# Patient Record
Sex: Female | Born: 1970 | Race: Black or African American | Hispanic: No | Marital: Single | Smoking: Current some day smoker
Health system: Southern US, Community
[De-identification: ages and names within clinical notes are randomized; demographics above are authoritative.]

## PROBLEM LIST (undated history)

## (undated) DIAGNOSIS — E669 Obesity, unspecified: Secondary | ICD-10-CM

---

## 2008-05-27 HISTORY — PX: HERNIA REPAIR: SHX51

## 2014-07-05 ENCOUNTER — Encounter (HOSPITAL_COMMUNITY): Payer: Self-pay | Admitting: Emergency Medicine

## 2014-07-05 ENCOUNTER — Emergency Department (HOSPITAL_COMMUNITY)
Admission: EM | Admit: 2014-07-05 | Discharge: 2014-07-05 | Disposition: A | Discharge status: Home / Self Care | Payer: Medicaid Other | Attending: Emergency Medicine | Admitting: Emergency Medicine

## 2014-07-05 ENCOUNTER — Emergency Department (HOSPITAL_COMMUNITY): Payer: Medicaid Other

## 2014-07-05 DIAGNOSIS — N939 Abnormal uterine and vaginal bleeding, unspecified: Secondary | ICD-10-CM

## 2014-07-05 DIAGNOSIS — Z3490 Encounter for supervision of normal pregnancy, unspecified, unspecified trimester: Secondary | ICD-10-CM

## 2014-07-05 DIAGNOSIS — O2342 Unspecified infection of urinary tract in pregnancy, second trimester: Secondary | ICD-10-CM | POA: Diagnosis not present

## 2014-07-05 DIAGNOSIS — O23592 Infection of other part of genital tract in pregnancy, second trimester: Secondary | ICD-10-CM | POA: Diagnosis not present

## 2014-07-05 DIAGNOSIS — Z3A15 15 weeks gestation of pregnancy: Secondary | ICD-10-CM | POA: Insufficient documentation

## 2014-07-05 DIAGNOSIS — O99332 Smoking (tobacco) complicating pregnancy, second trimester: Secondary | ICD-10-CM | POA: Diagnosis not present

## 2014-07-05 DIAGNOSIS — O2340 Unspecified infection of urinary tract in pregnancy, unspecified trimester: Secondary | ICD-10-CM

## 2014-07-05 DIAGNOSIS — B9689 Other specified bacterial agents as the cause of diseases classified elsewhere: Secondary | ICD-10-CM

## 2014-07-05 DIAGNOSIS — N760 Acute vaginitis: Secondary | ICD-10-CM

## 2014-07-05 DIAGNOSIS — F17210 Nicotine dependence, cigarettes, uncomplicated: Secondary | ICD-10-CM | POA: Diagnosis not present

## 2014-07-05 DIAGNOSIS — O209 Hemorrhage in early pregnancy, unspecified: Secondary | ICD-10-CM | POA: Diagnosis present

## 2014-07-05 LAB — CBC WITH DIFFERENTIAL/PLATELET
BASOS PCT: 0 % (ref 0–1)
Basophils Absolute: 0 10*3/uL (ref 0.0–0.1)
Eosinophils Absolute: 0.6 10*3/uL (ref 0.0–0.7)
Eosinophils Relative: 6 % — ABNORMAL HIGH (ref 0–5)
HCT: 38.6 % (ref 36.0–46.0)
HEMOGLOBIN: 12.9 g/dL (ref 12.0–15.0)
LYMPHS ABS: 3.9 10*3/uL (ref 0.7–4.0)
LYMPHS PCT: 39 % (ref 12–46)
MCH: 29.3 pg (ref 26.0–34.0)
MCHC: 33.4 g/dL (ref 30.0–36.0)
MCV: 87.7 fL (ref 78.0–100.0)
MONOS PCT: 5 % (ref 3–12)
Monocytes Absolute: 0.5 10*3/uL (ref 0.1–1.0)
NEUTROS PCT: 50 % (ref 43–77)
Neutro Abs: 5.1 10*3/uL (ref 1.7–7.7)
Platelets: 249 10*3/uL (ref 150–400)
RBC: 4.4 MIL/uL (ref 3.87–5.11)
RDW: 16.6 % — ABNORMAL HIGH (ref 11.5–15.5)
WBC: 10 10*3/uL (ref 4.0–10.5)

## 2014-07-05 LAB — COMPREHENSIVE METABOLIC PANEL
ALBUMIN: 4.1 g/dL (ref 3.5–5.2)
ALK PHOS: 82 U/L (ref 39–117)
ALT: 17 U/L (ref 0–35)
AST: 19 U/L (ref 0–37)
Anion gap: 10 (ref 5–15)
BILIRUBIN TOTAL: 0.5 mg/dL (ref 0.3–1.2)
BUN: 8 mg/dL (ref 6–23)
CHLORIDE: 106 mmol/L (ref 96–112)
CO2: 20 mmol/L (ref 19–32)
Calcium: 9.4 mg/dL (ref 8.4–10.5)
Creatinine, Ser: 0.65 mg/dL (ref 0.50–1.10)
GFR calc Af Amer: >90 mL/min (ref >90)
GFR calc non Af Amer: >90 mL/min (ref >90)
Glucose, Bld: 92 mg/dL (ref 70–99)
Potassium: 3.8 mmol/L (ref 3.5–5.1)
Sodium: 136 mmol/L (ref 135–145)
Total Protein: 7.9 g/dL (ref 6.0–8.3)

## 2014-07-05 LAB — POC URINE PREG, ED: PREG TEST UR: POSITIVE — AB

## 2014-07-05 LAB — URINALYSIS, ROUTINE W REFLEX MICROSCOPIC
Bilirubin Urine: NEGATIVE
GLUCOSE, UA: NEGATIVE mg/dL
KETONES UR: NEGATIVE mg/dL
Nitrite: NEGATIVE
PROTEIN: NEGATIVE mg/dL
Specific Gravity, Urine: 1.011 (ref 1.005–1.030)
UROBILINOGEN UA: 0.2 mg/dL (ref 0.0–1.0)
pH: 6 (ref 5.0–8.0)

## 2014-07-05 LAB — WET PREP, GENITAL
Trich, Wet Prep: NONE SEEN
Yeast Wet Prep HPF POC: NONE SEEN

## 2014-07-05 LAB — HCG, QUANTITATIVE, PREGNANCY: HCG, BETA CHAIN, QUANT, S: 11247 m[IU]/mL — AB (ref <5)

## 2014-07-05 LAB — ABO/RH: ABO/RH(D): A POS

## 2014-07-05 LAB — URINE MICROSCOPIC-ADD ON

## 2014-07-05 MED ORDER — NITROFURANTOIN MONOHYD MACRO 100 MG PO CAPS: 100.0000 mg | ORAL_CAPSULE | Freq: Two times a day (BID) | ORAL | Status: DC

## 2014-07-05 MED ORDER — METRONIDAZOLE 500 MG PO TABS: 500.0000 mg | ORAL_TABLET | Freq: Two times a day (BID) | ORAL | Status: DC

## 2014-07-05 NOTE — ED Provider Notes (Signed)
CSN: [id number]     Arrival date & time 07/05/14  1209 History   None    Chief Complaint  Patient presents with  . Vaginal Bleeding     (Consider location/radiation/quality/duration/timing/severity/associated sxs/prior Treatment) HPI  44 year old female who is [id number] who presents to ED with painless vaginal bleeding that began today. Patient states she is currently pregnant and believes she is around [redacted] weeks pregnant. She has not had any prenatal care at this time. She is also not had a ultrasound as of yet. She denies abdominal pain, nausea, vomiting. She does note having a small amount of vaginal bleeding and vaginal discharge. She denies any new sexual partners. Also denies fevers, dysuria. Patient states she has never had bleeding with any of her other pregnancies. No other complaints at this time.   History reviewed. No pertinent past medical history. History reviewed. No pertinent past surgical history. History reviewed. No pertinent family history. History  Substance Use Topics  . Smoking status: Current Every Day Smoker  . Smokeless tobacco: Not on file  . Alcohol Use: No   OB History    Gravida Para Term Preterm AB TAB SAB Ectopic Multiple Living   1              Review of Systems  Constitutional: Negative for fever and chills.  HENT: Negative for congestion, rhinorrhea and sore throat.   Eyes: Negative for visual disturbance.  Respiratory: Negative for cough and shortness of breath.   Cardiovascular: Negative for chest pain, palpitations and leg swelling.  Gastrointestinal: Negative for nausea, vomiting, abdominal pain, diarrhea and constipation.  Genitourinary: Positive for vaginal bleeding and vaginal discharge. Negative for dysuria and hematuria.  Musculoskeletal: Negative for back pain and neck pain.  Skin: Negative for rash.  Neurological: Negative for weakness and headaches.  All other systems reviewed and are negative.     Allergies  Review of patient's  allergies indicates no known allergies.  Home Medications   Prior to Admission medications   Not on File   BP 124/73 mmHg  Pulse 97  Temp(Src) 98.7 F (37.1 C) (Oral)  Resp 18  SpO2 100%  LMP 03/20/2014 Physical Exam  Constitutional: She is oriented to person, place, and time. She appears well-developed and well-nourished. No distress.  HENT:  Head: Normocephalic and atraumatic.  Eyes: Conjunctivae are normal.  Neck: Normal range of motion.  Cardiovascular: Normal rate, regular rhythm, normal heart sounds and intact distal pulses.   No murmur heard. Pulmonary/Chest: Effort normal and breath sounds normal. No respiratory distress. She has no wheezes. She has no rales. She exhibits no tenderness.  Abdominal: Soft. Bowel sounds are normal. She exhibits no distension and no mass. There is no tenderness. There is no rebound and no guarding.  Genitourinary: Uterus is not enlarged and not tender. Cervix exhibits discharge (thick yellow / brown as well as small amount of blood). Cervix exhibits no motion tenderness and no friability. Right adnexum displays no mass, no tenderness and no fullness. Left adnexum displays no mass, no tenderness and no fullness. There is bleeding in the vagina. No erythema or tenderness in the vagina. Vaginal discharge found.  Musculoskeletal: Normal range of motion.  Neurological: She is alert and oriented to person, place, and time. No cranial nerve deficit.  Skin: Skin is warm and dry.  Psychiatric: She has a normal mood and affect.  Nursing note and vitals reviewed.   ED Course  Procedures (including critical care time) Labs Review Labs Reviewed  URINALYSIS,  ROUTINE W REFLEX MICROSCOPIC - Abnormal; Notable for the following:    APPearance CLOUDY (*)    Hgb urine dipstick MODERATE (*)    Leukocytes, UA MODERATE (*)    All other components within normal limits  CBC WITH DIFFERENTIAL/PLATELET - Abnormal; Notable for the following:    RDW 16.6 (*)     Eosinophils Relative 6 (*)    All other components within normal limits  HCG, QUANTITATIVE, PREGNANCY - Abnormal; Notable for the following:    hCG, Beta Chain, Quant, S 11247 (*)    All other components within normal limits  URINE MICROSCOPIC-ADD ON - Abnormal; Notable for the following:    Squamous Epithelial / LPF FEW (*)    Bacteria, UA FEW (*)    All other components within normal limits  POC URINE PREG, ED - Abnormal; Notable for the following:    Preg Test, Ur POSITIVE (*)    All other components within normal limits  WET PREP, GENITAL  COMPREHENSIVE METABOLIC PANEL  ABO/RH  GC/CHLAMYDIA PROBE AMP ([place])    Imaging Review US Ob Comp Less 14 Wks  07/05/2014   CLINICAL DATA:  Vaginal bleeding, spotting.  EXAM: OBSTETRIC <14 WK [place] AND TRANSVAGINAL OB US  TECHNIQUE: Both transabdominal and transvaginal ultrasound examinations were performed for complete evaluation of the gestation as well as the maternal uterus, adnexal regions, and pelvic cul-de-sac. Transvaginal technique was performed to assess early pregnancy.  COMPARISON:  None.  FINDINGS: Intrauterine gestational sac: Visualized/ somewhat elongated in shape.  Yolk sac:  Visualized  Embryo:  Not definitively visualized  Cardiac Activity: Not visualized  Heart Rate:   bpm  MSD: 21.9  mm   7 w   1  d  CRL:    mm    w    d                  [place] EDC:  Maternal uterus/adnexae: No subchorionic hemorrhage. Corpus luteal cyst in the left ovary. No adnexal masses. No free fluid.  IMPRESSION: Seven week 1 day intrauterine pregnancy by mean sac diameter. No definite fetal pole currently. Recommend followup ultrasound in the 10-14 days.   Electronically Signed   By: Charlett Nose M.D.   On: 07/05/2014 16:38   US Ob Transvaginal  07/05/2014   CLINICAL DATA:  Vaginal bleeding, spotting.  EXAM: OBSTETRIC <14 WK [place] AND TRANSVAGINAL OB US  TECHNIQUE: Both transabdominal and transvaginal ultrasound examinations were performed for complete evaluation  of the gestation as well as the maternal uterus, adnexal regions, and pelvic cul-de-sac. Transvaginal technique was performed to assess early pregnancy.  COMPARISON:  None.  FINDINGS: Intrauterine gestational sac: Visualized/ somewhat elongated in shape.  Yolk sac:  Visualized  Embryo:  Not definitively visualized  Cardiac Activity: Not visualized  Heart Rate:   bpm  MSD: 21.9  mm   7 w   1  d  CRL:    mm    w    d                  [place] EDC:  Maternal uterus/adnexae: No subchorionic hemorrhage. Corpus luteal cyst in the left ovary. No adnexal masses. No free fluid.  IMPRESSION: Seven week 1 day intrauterine pregnancy by mean sac diameter. No definite fetal pole currently. Recommend followup ultrasound in the 10-14 days.   Electronically Signed   By: Charlett Nose M.D.   On: 07/05/2014 16:38     EKG Interpretation None  MDM   Final diagnoses:  None    Katherine Goodman is a 44 y.o. female with H&P as above. Pt pregnant and p/w VB. Pt estimates she is 15 wk 2 days per LMP.  EMERGENCY DEPARTMENT [place] PREGNANCY "Study: Limited Ultrasound of the Pelvis"  INDICATIONS:Pregnancy(required) and Vaginal bleeding Multiple views of the uterus and pelvic cavity are obtained with a multi-frequency probe.  APPROACH:Transabdominal   PERFORMED BY: Myself  IMAGES ARCHIVED?: Yes  LIMITATIONS: Body habitus   PREGNANCY FREE FLUID: none  PREGNANCY UTERUS FINDINGS:Uterus enlarged and Gestational sac noted ADNEXAL FINDINGS: no cysts or mass  PREGNANCY FINDINGS: Intrauterine gestational sac noted, No fetal heart activity seen, No yolk sac noted and No fetal pole seen  INTERPRETATION: No visualized intrauterine pregnancy  GESTATIONAL AGE, ESTIMATE: n/a  FETAL HEART RATE: n/a  COMMENT(Estimate of Gestational Age):  n/a  Pt is A+. Hcg is 11k. Unable to visualize IUP on BSUS. Pt stable, without abd pain. Sent for formal US. Formal shows seven week 1 day intrauterine pregnancy by mean sac diameter.  No definite fetal pole currently.  Advised pt get repeat quant at [facility] in 2 days.  Clinical Impression: 1. BV (bacterial vaginosis)   2. Vaginal bleeding   3. UTI in pregnancy, unspecified trimester   4. Pregnancy     Disposition: Discharge  Condition: Good  I have discussed the results, Dx and Tx plan with the pt(& family if present). He/she/they expressed understanding and agree(s) with the plan. Discharge instructions discussed at great length. Strict return precautions discussed and pt &/or family have verbalized understanding of the instructions. No further questions at time of discharge.    New Prescriptions   METRONIDAZOLE (FLAGYL) 500 MG TABLET    Take 1 tablet (500 mg total) by mouth 2 (two) times daily.   NITROFURANTOIN, MACROCRYSTAL-MONOHYDRATE, (MACROBID) 100 MG CAPSULE    Take 1 capsule (100 mg total) by mouth 2 (two) times daily.    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 [id number] [place] [address] [phone]  If symptoms worsen   Pt seen in conjunction with Dr. Candyce Churn III, *  Ames Dura, [place] VOZ Emergency Medicine Resident - PGY-2    Ames Dura, MD 07/05/14 1646  Merrie Roof, MD 07/05/14 2043

## 2014-07-05 NOTE — ED Notes (Signed)
Pt sts pregnant with LMP was 03/20/14; pt sts some spotting starting today; pt G6 P3 M2; pt denies cramping

## 2014-07-06 LAB — GC/CHLAMYDIA PROBE AMP (~~LOC~~) NOT AT ARMC
Chlamydia: NEGATIVE
Neisseria Gonorrhea: NEGATIVE

## 2014-07-07 ENCOUNTER — Emergency Department (HOSPITAL_COMMUNITY)
Admission: EM | Admit: 2014-07-07 | Discharge: 2014-07-07 | Disposition: A | Discharge status: Home / Self Care | Payer: Medicaid Other | Attending: Emergency Medicine | Admitting: Emergency Medicine

## 2014-07-07 ENCOUNTER — Encounter (HOSPITAL_COMMUNITY): Payer: Self-pay | Admitting: Emergency Medicine

## 2014-07-07 DIAGNOSIS — Z792 Long term (current) use of antibiotics: Secondary | ICD-10-CM | POA: Diagnosis not present

## 2014-07-07 DIAGNOSIS — Z3A15 15 weeks gestation of pregnancy: Secondary | ICD-10-CM | POA: Insufficient documentation

## 2014-07-07 DIAGNOSIS — O99212 Obesity complicating pregnancy, second trimester: Secondary | ICD-10-CM | POA: Insufficient documentation

## 2014-07-07 DIAGNOSIS — Z79899 Other long term (current) drug therapy: Secondary | ICD-10-CM | POA: Insufficient documentation

## 2014-07-07 DIAGNOSIS — O209 Hemorrhage in early pregnancy, unspecified: Secondary | ICD-10-CM | POA: Diagnosis present

## 2014-07-07 DIAGNOSIS — F17200 Nicotine dependence, unspecified, uncomplicated: Secondary | ICD-10-CM | POA: Insufficient documentation

## 2014-07-07 DIAGNOSIS — O99332 Smoking (tobacco) complicating pregnancy, second trimester: Secondary | ICD-10-CM | POA: Insufficient documentation

## 2014-07-07 DIAGNOSIS — N939 Abnormal uterine and vaginal bleeding, unspecified: Secondary | ICD-10-CM

## 2014-07-07 DIAGNOSIS — O039 Complete or unspecified spontaneous abortion without complication: Secondary | ICD-10-CM

## 2014-07-07 DIAGNOSIS — E669 Obesity, unspecified: Secondary | ICD-10-CM | POA: Insufficient documentation

## 2014-07-07 LAB — HCG, QUANTITATIVE, PREGNANCY: hCG, Beta Chain, Quant, S: 8432 m[IU]/mL — ABNORMAL HIGH (ref <5)

## 2014-07-07 MED ORDER — IBUPROFEN 800 MG PO TABS: 800.0000 mg | ORAL_TABLET | Freq: Once | ORAL | Status: DC

## 2014-07-07 MED ORDER — IBUPROFEN 800 MG PO TABS
800.0000 mg | ORAL_TABLET | Freq: Once | ORAL | Status: AC
  Administered 2014-07-07: 800 mg via ORAL
  Filled 2014-07-07: qty 1

## 2014-07-07 NOTE — ED Notes (Signed)
Pt requesting discharge paperwork.  Informed pt that PA still working on her discharge and may have further questions.  Promised to bring papers once printed.    Pt then requesting pain medicine.  Spoke to PA Romeo Apple(Ben), made aware.

## 2014-07-07 NOTE — Discharge Instructions (Signed)
Abnormal Uterine Bleeding Abnormal uterine bleeding can affect women at various stages in life, including teenagers, women in their reproductive years, pregnant women, and women who have reached menopause. Several kinds of uterine bleeding are considered abnormal, including:  Bleeding or spotting between periods.   Bleeding after sexual intercourse.   Bleeding that is heavier or more than normal.   Periods that last longer than usual.  Bleeding after menopause.  Many cases of abnormal uterine bleeding are minor and simple to treat, while others are more serious. Any type of abnormal bleeding should be evaluated by your health care provider. Treatment will depend on the cause of the bleeding. HOME CARE INSTRUCTIONS Monitor your condition for any changes. The following actions may help to alleviate any discomfort you are experiencing:  Avoid the use of tampons and douches as directed by your health care provider.  Change your pads frequently. You should get regular pelvic exams and Pap tests. Keep all follow-up appointments for diagnostic tests as directed by your health care provider.  SEEK MEDICAL CARE IF:   Your bleeding lasts more than 1 week.   You feel dizzy at times.  SEEK IMMEDIATE MEDICAL CARE IF:   You pass out.   You are changing pads every 15 to 30 minutes.   You have abdominal pain.  You have a fever.   You become sweaty or weak.   You are passing large blood clots from the vagina.   You start to feel nauseous and vomit. MAKE SURE YOU:   Understand these instructions.  Will watch your condition.  Will get help right away if you are not doing well or get worse. Document Released: 05/13/2005 Document Revised: 05/18/2013 Document Reviewed: 12/10/2012 [facility] Patient Information 2015 [place], [place]. This information is not intended to replace advice given to you by your health care provider. Make sure you discuss any questions you have with your  health care provider.  You were evaluated in the ED today for your vaginal bleeding. You're found to have a potential miscarriage. It is important for you to follow-up with Center or your OB/GYN for definitive care within the next 48 hours. Please return to ED for new or worsening symptoms. You may take your ibuprofen as needed for pain or discomfort.

## 2014-07-07 NOTE — ED Provider Notes (Signed)
CSN: [id number]     Arrival date & time 07/07/14  1601 History   First MD Initiated Contact with Patient 07/07/14 1728     Chief Complaint  Patient presents with  . Vaginal Bleeding    pregnant     (Consider location/radiation/quality/duration/timing/severity/associated sxs/prior Treatment) HPI Katherine Goodman is a 44 y.o. female G6 [id number] who comes in for evaluation of painless vaginal bleeding. Patient was seen 2 days ago in this ED for same complaint. She reports she was wiping after urinating today and saw some redness and thought it was blood and wanted to come back in for reevaluation. She denies any abdominal pain, no other vaginal complaints. She does not have an OB/GYN and has not received any follow-up at this point. She reports having an ultrasound done when she was here 2 days ago and confirmed an IUP. She denies any other symptoms at this time. No fevers, chills, chest pain, source of breath, cough, nausea or vomiting, diarrhea or constipation, abdominal pain, numbness or weakness, rash, dizziness or syncope. Patient blood type is A positive. She is taking prenatal vitamins.  History reviewed. No pertinent past medical history. History reviewed. No pertinent past surgical history. No family history on file. History  Substance Use Topics  . Smoking status: Current Every Day Smoker  . Smokeless tobacco: Not on file  . Alcohol Use: No   OB History    Gravida Para Term Preterm AB TAB SAB Ectopic Multiple Living   1              Review of Systems  All other systems reviewed and are negative.  A 10 point review of systems was completed and was negative except for pertinent positives and negatives as mentioned in the history of present illness     Allergies  Review of patient's allergies indicates no known allergies.  Home Medications   Prior to Admission medications   Medication Sig Start Date End Date Taking? Authorizing Provider  metroNIDAZOLE (FLAGYL) 500 MG  tablet Take 1 tablet (500 mg total) by mouth 2 (two) times daily. 07/05/14  Yes Ames DuraStephen Balleh, MD  Multiple Vitamin (MULTIVITAMIN WITH MINERALS) TABS tablet Take 1 tablet by mouth daily.   Yes Historical Provider, MD  nitrofurantoin, macrocrystal-monohydrate, (MACROBID) 100 MG capsule Take 1 capsule (100 mg total) by mouth 2 (two) times daily. 07/05/14  Yes Ames DuraStephen Balleh, MD  Prenatal Vit-Fe Fumarate-FA (PRENATAL MULTIVITAMIN) TABS tablet Take 1 tablet by mouth daily at 12 noon.   Yes Historical Provider, MD  ibuprofen (ADVIL,MOTRIN) 800 MG tablet Take 1 tablet (800 mg total) by mouth once. 07/07/14   Earle GellBenjamin W Orvin Netter, PA-C   BP 118/74 mmHg  Pulse 76  Temp(Src) 98.1 F (36.7 C) (Oral)  Resp 16  Ht 5\' 4"  (1.626 m)  Wt 210 lb (95.255 kg)  BMI 36.03 kg/m2  SpO2 98%  LMP 03/20/2014 Physical Exam  Constitutional: She is oriented to person, place, and time. She appears well-developed and well-nourished.  Obese  HENT:  Head: Normocephalic and atraumatic.  Mouth/Throat: Oropharynx is clear and moist.  Eyes: Conjunctivae are normal. Pupils are equal, round, and reactive to light. Right eye exhibits no discharge. Left eye exhibits no discharge. No scleral icterus.  Neck: Neck supple.  Cardiovascular: Normal rate, regular rhythm and normal heart sounds.   Pulmonary/Chest: Effort normal and breath sounds normal. No respiratory distress. She has no wheezes. She has no rales.  Abdominal: Soft. There is no tenderness.  Genitourinary:  Chaperone was present  for the entire genital exam. No lesions or rashes appreciated on vulva. Difficult to visualize cervical oz due to body habitus. Blood and clots in vaginal vault. Upon bi manual exam- No TTP of the adnexa, no cervical motion tenderness. No fullness or masses appreciated. No abnormalities appreciated in structural anatomy.    Musculoskeletal: She exhibits no tenderness.  Neurological: She is alert and oriented to person, place, and time.   Cranial Nerves II-XII grossly intact  Skin: Skin is warm and dry. No rash noted.  Psychiatric: She has a normal mood and affect.  Nursing note and vitals reviewed.   ED Course  Procedures (including critical care time) Labs Review Labs Reviewed  HCG, QUANTITATIVE, PREGNANCY - Abnormal; Notable for the following:    hCG, Beta Chain, Quant, S 8432 (*)    All other components within normal limits    Imaging Review No results found.   EKG Interpretation None     Meds given in ED:  Medications  ibuprofen (ADVIL,MOTRIN) tablet 800 mg (800 mg Oral Given 07/07/14 2036)    Discharge Medication List as of 07/07/2014  8:39 PM    START taking these medications   Details  ibuprofen (ADVIL,MOTRIN) 800 MG tablet Take 1 tablet (800 mg total) by mouth once., Starting 07/07/2014, Print       Filed Vitals:   07/07/14 1900 07/07/14 1930 07/07/14 1945 07/07/14 2048  BP: 114/58 120/72 129/74 118/74  Pulse: 85 84 85 76  Temp:    98.1 F (36.7 C)  TempSrc:    Oral  Resp:    16  Height:      Weight:      SpO2: 100% 100% 100% 98%    MDM  Vitals stable - WNL -afebrile Pt resting comfortably in ED. PE--benign abdominal exam, pelvic exam as mentioned above. Labwork--repeated hCG Quant, [id number]  DDX--painless vaginal bleeding without any apparent complication. Patient had a complete workup 2 days ago, I do not feel it is necessary to reorder repeat labs at this time, other than quantitative hCG. Repeat quant was 8432 which is down from 11247 on 2/9. On Pelvic exam there is blood clots but visualized products of conception. Cannot fully visualize os due to body habitus. Increased blood and decreased hcg is concerning for spontaneous abortion. On [place] 2/9 the yolk sac was visualized, but no fetal pole. Low concern for ectopic. Discussed patient will need to follow-up with OB for definitive care. Will DC with Motrin for discomfort related to abd cramping. I discussed all relevant lab findings and  imaging results with pt and they verbalized understanding. Discussed f/u with PCP within 48 hrs and return precautions, pt very amenable to plan.  Prior to patient discharge, I discussed and reviewed this case with Dr.Linker     Final diagnoses:  Vaginal bleeding  Spontaneous abortion     Sharlene Motts, PA-C 07/08/14 1220  Ethelda Chick, MD 07/09/14 [phone]

## 2014-07-07 NOTE — ED Notes (Signed)
Pt here for worsening vaginal bleeding- sts she is passing clots now and having lower abdominal cramps and back pain. [facility].

## 2014-08-25 ENCOUNTER — Emergency Department (HOSPITAL_COMMUNITY)
Admission: EM | Admit: 2014-08-25 | Discharge: 2014-08-25 | Disposition: A | Discharge status: Home / Self Care | Payer: Medicaid Other | Attending: Emergency Medicine | Admitting: Emergency Medicine

## 2014-08-25 ENCOUNTER — Encounter (HOSPITAL_COMMUNITY): Payer: Self-pay | Admitting: *Deleted

## 2014-08-25 DIAGNOSIS — Z79899 Other long term (current) drug therapy: Secondary | ICD-10-CM | POA: Insufficient documentation

## 2014-08-25 DIAGNOSIS — Z720 Tobacco use: Secondary | ICD-10-CM | POA: Diagnosis not present

## 2014-08-25 DIAGNOSIS — R51 Headache: Secondary | ICD-10-CM | POA: Insufficient documentation

## 2014-08-25 DIAGNOSIS — K029 Dental caries, unspecified: Secondary | ICD-10-CM | POA: Diagnosis not present

## 2014-08-25 DIAGNOSIS — K088 Other specified disorders of teeth and supporting structures: Secondary | ICD-10-CM | POA: Diagnosis present

## 2014-08-25 MED ORDER — OXYCODONE-ACETAMINOPHEN 5-325 MG PO TABS: ORAL_TABLET | ORAL | Status: DC

## 2014-08-25 MED ORDER — AMOXICILLIN 500 MG PO CAPS: 500.0000 mg | ORAL_CAPSULE | Freq: Three times a day (TID) | ORAL | Status: DC

## 2014-08-25 MED ORDER — BUPIVACAINE-EPINEPHRINE (PF) 0.5% -1:200000 IJ SOLN
1.8000 mL | Freq: Once | INTRAMUSCULAR | Status: AC
  Administered 2014-08-25: 1.8 mL

## 2014-08-25 MED ORDER — AMOXICILLIN 500 MG PO CAPS
500.0000 mg | ORAL_CAPSULE | Freq: Once | ORAL | Status: AC
  Administered 2014-08-25: 500 mg via ORAL
  Filled 2014-08-25: qty 1

## 2014-08-25 NOTE — Discharge Instructions (Signed)
Take percocet for breakthrough pain, do not drink alcohol, drive, care for children or do other critical tasks while taking percocet.  Return to the emergency room for fever, change in vision, redness to the face that rapidly spreads towards the eye, nausea or vomiting, difficulty swallowing or shortness of breath.   Apply warm compresses to jaw throughout the day.   Take your antibiotics as directed and to the end of the course.   Followup with a dentist is very important for ongoing evaluation and management of recurrent dental pain. Return to emergency department for emergent changing or worsening symptoms."  Low-cost dental clinic: Katherine Goodman**Katherine Goodman  at [phone]**  **Katherine Goodman at [phone] [address]**    You may also call [phone]  Dental Assistance If the dentist on-call cannot see you, please use the resources below:   Patients with Medicaid: [facility] [phone], [phone] [address], [phone]  If unable to pay, or uninsured, contact [facility] [phone]) or [facility] [phone] in [place], [phone] in [facility] [place]) to become qualified for the adult dental clinic  Other Low-Cost Community Dental Services: [facility]- [address], [place], [place], [id number]    [phone], Ext. [phone]    2nd and 4th Thursday of the month at 6:30am    10 clients each day by appointment, can sometimes see walk-in     patients if someone does not show for an appointment [facility]- [address], [place], [place], [id number]    [phone] [facility]- [address], [place], [place], [address]    [phone]  [facility]- [phone] [facility]- [phone] [phone]- [phone]   Dental Pain A tooth ache may be caused by cavities (tooth decay). Cavities expose the nerve of the tooth to air and hot or  cold temperatures. It may come from an infection or abscess (also called a boil or furuncle) around your tooth. It is also often caused by dental caries (tooth decay). This causes the pain you are having. DIAGNOSIS  Your caregiver can diagnose this problem by exam. TREATMENT   If caused by an infection, it may be treated with medications which kill germs (antibiotics) and pain medications as prescribed by your caregiver. Take medications as directed.  Only take over-the-counter or prescription medicines for pain, discomfort, or fever as directed by your caregiver.  Whether the tooth ache today is caused by infection or dental disease, you should see your dentist as soon as possible for further care. SEEK MEDICAL CARE IF: The exam and treatment you received today has been provided on an emergency basis only. This is not a substitute for complete medical or dental care. If your problem worsens or new problems (symptoms) appear, and you are unable to meet with your dentist, call or return to this location. SEEK IMMEDIATE MEDICAL CARE IF:   You have a fever.  You develop redness and swelling of your face, jaw, or neck.  You are unable to open your mouth.  You have severe pain uncontrolled by pain medicine. MAKE SURE YOU:   Understand these instructions.  Will watch your condition.  Will get help right away if you are not doing well or get worse. Document Released: 05/13/2005 Document Revised: 08/05/2011 Document Reviewed: 12/30/2007 [facility] Patient Information 2015 [place], [place]. This information is not intended to replace advice given to you by your health care provider. Make sure  you discuss any questions you have with your health care provider. ° °

## 2014-08-25 NOTE — ED Provider Notes (Signed)
CSN: [id number]     Arrival date & time 08/25/14  1725 History  This chart was scribed for non-physician practitioner Wynetta EmeryNicole Aniella Wandrey, PA-C working with Vanetta MuldersScott Zackowski, MD by Murriel HopperAlec Bankhead, ED Scribe. This patient was seen in room TR11C/TR11C and the patient's care was started at 6:11 PM.    Chief Complaint  Patient presents with  . Dental Pain      The history is provided by the patient. No language interpreter was used.     HPI Comments: Katherine Goodman is a 44 y.o. female who presents to the Emergency Department complaining of constant, worsening dental pain with associated generalized headache that has been present since earlier today. Pt states that her dental pain is 10/10 in severity. Pt states that she took advil earlier today with no relief. Denies fever/chills, difficulty opening jaw, difficulty swallowing, SOB, gum swelling, facial swelling, neck swelling.   History reviewed. No pertinent past medical history. History reviewed. No pertinent past surgical history. No family history on file. History  Substance Use Topics  . Smoking status: Current Every Day Smoker  . Smokeless tobacco: Not on file  . Alcohol Use: No   OB History    Gravida Para Term Preterm AB TAB SAB Ectopic Multiple Living   1              Review of Systems  HENT: Positive for dental problem.   Neurological: Positive for headaches.      Allergies  Review of patient's allergies indicates no known allergies.  Home Medications   Prior to Admission medications   Medication Sig Start Date End Date Taking? Authorizing Provider  ibuprofen (ADVIL,MOTRIN) 800 MG tablet Take 1 tablet (800 mg total) by mouth once. Patient taking differently: Take 800 mg by mouth daily as needed for moderate pain.  07/07/14  Yes Joycie PeekBenjamin Cartner, PA-C  Multiple Vitamin (MULTIVITAMIN WITH MINERALS) TABS tablet Take 1 tablet by mouth daily.   Yes Historical Provider, MD  metroNIDAZOLE (FLAGYL) 500 MG tablet Take 1  tablet (500 mg total) by mouth 2 (two) times daily. Patient not taking: Reported on 08/25/2014 07/05/14   Ames DuraStephen Balleh, MD  nitrofurantoin, macrocrystal-monohydrate, (MACROBID) 100 MG capsule Take 1 capsule (100 mg total) by mouth 2 (two) times daily. Patient not taking: Reported on 08/25/2014 07/05/14   Ames DuraStephen Balleh, MD  Prenatal Vit-Fe Fumarate-FA (PRENATAL MULTIVITAMIN) TABS tablet Take 1 tablet by mouth daily at 12 noon.    Historical Provider, MD   BP 164/97 mmHg  Pulse 88  Temp(Src) 98.4 F (36.9 C) (Oral)  Resp 20  SpO2 100%  LMP 08/05/2014 Physical Exam  Constitutional: She is oriented to person, place, and time. She appears well-developed and well-nourished. No distress.  HENT:  Head: Normocephalic.  Mouth/Throat: Uvula is midline and oropharynx is clear and moist. No trismus in the jaw. No uvula swelling. No oropharyngeal exudate, posterior oropharyngeal edema, posterior oropharyngeal erythema or tonsillar abscesses.  Generally poor dentition, no gingival swelling, erythema or tenderness to palpation. Patient is handling their secretions. There is no tenderness to palpation or firmness underneath tongue bilaterally. No trismus.    Eyes: Conjunctivae and EOM are normal.  Cardiovascular: Normal rate.   Pulmonary/Chest: Effort normal. No stridor.  Musculoskeletal: Normal range of motion.  Lymphadenopathy:    She has no cervical adenopathy.  Neurological: She is alert and oriented to person, place, and time.  Psychiatric: She has a normal mood and affect.  Nursing note and vitals reviewed.   ED Course  NERVE  BLOCK Date/Time: 08/26/2014 1:40 AM Performed by: Wynetta Emery Authorized by: Wynetta Emery Consent: Verbal consent obtained. Consent given by: patient Patient identity confirmed: verbally with patient Indications: pain relief Body area: face/mouth Nerve: posterior superior alveolar Laterality: right Patient sedated: no Patient position: sitting Needle  gauge: 27 G Location technique: anatomical landmarks Local anesthetic: bupivacaine 0.5% with epinephrine Anesthetic total: 1.8 ml Outcome: pain improved Patient tolerance: Patient tolerated the procedure well with no immediate complications   (including critical care time)  DIAGNOSTIC STUDIES: Oxygen Satura2ion is 100% on room air, normal by my interpretation.    COORDINATION OF CARE: 6:12 PM Discussed treatment plan with pt at bedside and pt agreed to plan.    Labs Review Labs Reviewed - No data to display  Imaging Review No results found.   EKG Interpretation None      MDM   Final diagnoses:  Pain due to dental caries    Filed Vitals:   08/25/14 1759  BP: 164/97  Pulse: 88  Temp: 98.4 F (36.9 C)  TempSrc: Oral  Resp: 20  SpO2: 100%    Medications  bupivacaine-epinephrine (MARCAINE W/ EPI) 0.5% -1:200000 injection 1.8 mL (1.8 mLs Infiltration Given by Other 08/25/14 1820)  amoxicillin (AMOXIL) capsule 500 mg (500 mg Oral Given 08/25/14 1828)    Katherine Goodman is a pleasant 44 y.o. female presenting with dental pain associated with dental caries but no signs or symptoms of dental abscess. Patient afebrile, non toxic appearing and swallowing secretions well. I gave patient referral to dentist and stressed the importance of dental follow up for definitive management of dental issues. Patient voices understanding and is agreeable to plan.  Evaluation does not show pathology that would require ongoing emergent intervention or inpatient treatment. Pt is hemodynamically stable and mentating appropriately. Discussed findings and plan with patient/guardian, who agrees with care plan. All questions answered. Return precautions discussed and outpatient follow up given.   Discharge Medication List as of 08/25/2014  6:25 PM    START taking these medications   Details  amoxicillin (AMOXIL) 500 MG capsule Take 1 capsule (500 mg total) by mouth 3 (three) times daily.,  Starting 08/25/2014, Until Discontinued, Print    oxyCODONE-acetaminophen (PERCOCET/ROXICET) 5-325 MG per tablet 1 to 2 tabs PO q6hrs  PRN for pain, Print         I personally performed the services described in this documentation, which was scribed in my presence. The recorded information has been reviewed and is accurate.   Joni Reining Adalynd Donahoe, PA-C 08/26/14 1914  Vanetta Mulders, MD 08/30/14 [phone]

## 2014-08-25 NOTE — ED Notes (Signed)
Pt c/o dental pain that is causing a headache. States she does not have dentist because she just moved here. States her tooth is swollen

## 2014-10-24 ENCOUNTER — Encounter (HOSPITAL_COMMUNITY): Payer: Self-pay | Admitting: Emergency Medicine

## 2014-10-24 ENCOUNTER — Emergency Department (HOSPITAL_COMMUNITY)
Admission: EM | Admit: 2014-10-24 | Discharge: 2014-10-24 | Disposition: A | Discharge status: Home / Self Care | Payer: Medicaid Other | Attending: Emergency Medicine | Admitting: Emergency Medicine

## 2014-10-24 ENCOUNTER — Emergency Department (HOSPITAL_COMMUNITY): Payer: Medicaid Other

## 2014-10-24 DIAGNOSIS — R0602 Shortness of breath: Secondary | ICD-10-CM

## 2014-10-24 DIAGNOSIS — J0140 Acute pansinusitis, unspecified: Secondary | ICD-10-CM | POA: Insufficient documentation

## 2014-10-24 DIAGNOSIS — J209 Acute bronchitis, unspecified: Secondary | ICD-10-CM | POA: Diagnosis not present

## 2014-10-24 DIAGNOSIS — R000 Tachycardia, unspecified: Secondary | ICD-10-CM | POA: Diagnosis not present

## 2014-10-24 DIAGNOSIS — H7583 Other specified disorders of middle ear and mastoid in diseases classified elsewhere, bilateral: Secondary | ICD-10-CM | POA: Insufficient documentation

## 2014-10-24 DIAGNOSIS — Z87891 Personal history of nicotine dependence: Secondary | ICD-10-CM | POA: Diagnosis not present

## 2014-10-24 DIAGNOSIS — R05 Cough: Secondary | ICD-10-CM | POA: Diagnosis present

## 2014-10-24 DIAGNOSIS — J40 Bronchitis, not specified as acute or chronic: Secondary | ICD-10-CM

## 2014-10-24 DIAGNOSIS — Z79899 Other long term (current) drug therapy: Secondary | ICD-10-CM | POA: Diagnosis not present

## 2014-10-24 MED ORDER — OXYMETAZOLINE HCL 0.05 % NA SOLN
1.0000 | Freq: Once | NASAL | Status: AC
  Administered 2014-10-24: 1 via NASAL
  Filled 2014-10-24: qty 15

## 2014-10-24 MED ORDER — AZITHROMYCIN 250 MG PO TABS: 250.0000 mg | ORAL_TABLET | Freq: Every day | ORAL | Status: DC

## 2014-10-24 MED ORDER — HYDROCODONE-ACETAMINOPHEN 5-325 MG PO TABS: 1.0000 | ORAL_TABLET | Freq: Four times a day (QID) | ORAL | Status: DC | PRN

## 2014-10-24 NOTE — ED Notes (Signed)
Per EMS- pt has been sick since Thursday, productive cough, rhonchorous lungs. Tachycardic 120. Pt states she has been running fevers, has not measured her temp but states she has had chills. 99% RA. 182/94 BP, denies hx of HTN. Denies chest pain, reports frontal headache and pain in face around sinuses. Pt a/o x4.

## 2014-10-24 NOTE — ED Provider Notes (Addendum)
CSN: [id number]     Arrival date & time 10/24/14  1018 History   First MD Initiated Contact with Patient 10/24/14 1025     Chief Complaint  Patient presents with  . Cough     (Consider location/radiation/quality/duration/timing/severity/associated sxs/prior Treatment) Patient is a 44 y.o. female presenting with cough. The history is provided by the patient.  Cough Cough characteristics:  Productive Sputum characteristics:  Yellow Severity:  Moderate Onset quality:  Gradual Duration:  5 days Timing:  Constant Progression:  Worsening Chronicity:  New Smoker: yes   Context: sick contacts and upper respiratory infection   Relieved by:  Nothing Worsened by:  Lying down and activity Ineffective treatments:  Decongestant Associated symptoms: chills, ear pain, fever, headaches, rhinorrhea, shortness of breath and sinus congestion   Associated symptoms: no chest pain, no rash, no sore throat and no wheezing     History reviewed. No pertinent past medical history. History reviewed. No pertinent past surgical history. No family history on file. History  Substance Use Topics  . Smoking status: Former Smoker -- 0.50 packs/day    Types: Cigarettes  . Smokeless tobacco: Not on file  . Alcohol Use: No   OB History    Gravida Para Term Preterm AB TAB SAB Ectopic Multiple Living   1              Review of Systems  Constitutional: Positive for fever and chills.  HENT: Positive for ear pain and rhinorrhea. Negative for sore throat.   Respiratory: Positive for cough and shortness of breath. Negative for wheezing.   Cardiovascular: Negative for chest pain.  Skin: Negative for rash.  Neurological: Positive for headaches.  All other systems reviewed and are negative.     Allergies  Review of patient's allergies indicates no known allergies.  Home Medications   Prior to Admission medications   Medication Sig Start Date End Date Taking? Authorizing Provider  amoxicillin (AMOXIL)  500 MG capsule Take 1 capsule (500 mg total) by mouth 3 (three) times daily. 08/25/14   Nicole Pisciotta, PA-C  ibuprofen (ADVIL,MOTRIN) 800 MG tablet Take 1 tablet (800 mg total) by mouth once. Patient taking differently: Take 800 mg by mouth daily as needed for moderate pain.  07/07/14   Joycie PeekBenjamin Cartner, PA-C  metroNIDAZOLE (FLAGYL) 500 MG tablet Take 1 tablet (500 mg total) by mouth 2 (two) times daily. Patient not taking: Reported on 08/25/2014 07/05/14   Ames DuraStephen Balleh, MD  Multiple Vitamin (MULTIVITAMIN WITH MINERALS) TABS tablet Take 1 tablet by mouth daily.    Historical Provider, MD  nitrofurantoin, macrocrystal-monohydrate, (MACROBID) 100 MG capsule Take 1 capsule (100 mg total) by mouth 2 (two) times daily. Patient not taking: Reported on 08/25/2014 07/05/14   Ames DuraStephen Balleh, MD  oxyCODONE-acetaminophen (PERCOCET/ROXICET) 5-325 MG per tablet 1 to 2 tabs PO q6hrs  PRN for pain 08/25/14   Wynetta EmeryNicole Pisciotta, PA-C  Prenatal Vit-Fe Fumarate-FA (PRENATAL MULTIVITAMIN) TABS tablet Take 1 tablet by mouth daily at 12 noon.    Historical Provider, MD   BP 134/80 mmHg  Pulse 113  Temp(Src) 99.7 F (37.6 C) (Oral)  Resp 18  Ht 5\' 4"  (1.626 m)  SpO2 99%  LMP 10/20/2014 (Exact Date)  Breastfeeding? Unknown Physical Exam  Constitutional: She is oriented to person, place, and time. She appears well-developed and well-nourished. No distress.  HENT:  Head: Normocephalic and atraumatic.  Right Ear: A middle ear effusion is present.  Left Ear: A middle ear effusion is present.  Nose: Right sinus  exhibits maxillary sinus tenderness and frontal sinus tenderness. Left sinus exhibits maxillary sinus tenderness and frontal sinus tenderness.  Mouth/Throat: Oropharynx is clear and moist.  Eyes: Conjunctivae and EOM are normal. Pupils are equal, round, and reactive to light.  Neck: Normal range of motion. Neck supple.  Cardiovascular: Regular rhythm and intact distal pulses.  Tachycardia present.   No murmur  heard. Pulmonary/Chest: Effort normal and breath sounds normal. No respiratory distress. She has no wheezes. She has no rales.  Coughing repeatedly  Abdominal: Soft. She exhibits no distension. There is no tenderness. There is no rebound and no guarding.  Musculoskeletal: Normal range of motion. She exhibits no edema or tenderness.  Neurological: She is alert and oriented to person, place, and time.  Skin: Skin is warm and dry. No rash noted. No erythema.  Psychiatric: She has a normal mood and affect. Her behavior is normal.  Nursing note and vitals reviewed.   ED Course  Procedures (including critical care time) Labs Review Labs Reviewed - No data to display  Imaging Review Dg Chest 2 View  10/24/2014   CLINICAL DATA:  Productive cough  EXAM: CHEST  2 VIEW  COMPARISON:  None  FINDINGS: The heart size and mediastinal contours are within normal limits. Both lungs are clear. The visualized skeletal structures are unremarkable.  IMPRESSION: No active cardiopulmonary disease.   Electronically Signed   By: Natasha Mead M.D.   On: 10/24/2014 11:27     EKG Interpretation None      MDM   Final diagnoses:  SOB (shortness of breath)  Bronchitis  Subacute pansinusitis    Pt with symptoms consistent with viral URI vs bronchitis and sinus infection.  Well appearing here but persistent coughing.  No signs of breathing difficulty.  No signs of pharyngitis, otitis or abnormal abdominal findings.  Sinus tenderness and middle ear effusion. CXR wnl and pt to return with any further problems.   11:48 AM Pt started on pain control, nasal spray and azithro.  Gwyneth Sprout, MD 10/24/14 1148  Gwyneth Sprout, MD 10/24/14 1151

## 2014-10-24 NOTE — ED Notes (Signed)
Pt leaving department for xray without distress.

## 2014-12-19 ENCOUNTER — Other Ambulatory Visit: Payer: Self-pay | Admitting: Nurse Practitioner

## 2014-12-19 DIAGNOSIS — N63 Unspecified lump in unspecified breast: Secondary | ICD-10-CM

## 2014-12-22 ENCOUNTER — Other Ambulatory Visit: Payer: Self-pay | Admitting: Internal Medicine

## 2014-12-22 DIAGNOSIS — N63 Unspecified lump in unspecified breast: Secondary | ICD-10-CM

## 2014-12-23 ENCOUNTER — Ambulatory Visit
Admission: RE | Admit: 2014-12-23 | Discharge: 2014-12-23 | Disposition: A | Discharge status: Home / Self Care | Payer: Medicaid Other | Source: Ambulatory Visit | Attending: Internal Medicine | Admitting: Internal Medicine

## 2014-12-23 DIAGNOSIS — N63 Unspecified lump in unspecified breast: Secondary | ICD-10-CM

## 2015-02-27 ENCOUNTER — Encounter (HOSPITAL_COMMUNITY): Payer: Self-pay | Admitting: *Deleted

## 2015-02-27 ENCOUNTER — Emergency Department (HOSPITAL_COMMUNITY)
Admission: EM | Admit: 2015-02-27 | Discharge: 2015-02-27 | Disposition: A | Discharge status: Home / Self Care | Payer: Medicaid Other | Attending: Emergency Medicine | Admitting: Emergency Medicine

## 2015-02-27 DIAGNOSIS — E669 Obesity, unspecified: Secondary | ICD-10-CM | POA: Diagnosis not present

## 2015-02-27 DIAGNOSIS — Z79891 Long term (current) use of opiate analgesic: Secondary | ICD-10-CM | POA: Diagnosis not present

## 2015-02-27 DIAGNOSIS — N739 Female pelvic inflammatory disease, unspecified: Secondary | ICD-10-CM

## 2015-02-27 DIAGNOSIS — J40 Bronchitis, not specified as acute or chronic: Secondary | ICD-10-CM | POA: Diagnosis not present

## 2015-02-27 DIAGNOSIS — Z792 Long term (current) use of antibiotics: Secondary | ICD-10-CM | POA: Insufficient documentation

## 2015-02-27 DIAGNOSIS — Z87891 Personal history of nicotine dependence: Secondary | ICD-10-CM | POA: Insufficient documentation

## 2015-02-27 DIAGNOSIS — N898 Other specified noninflammatory disorders of vagina: Secondary | ICD-10-CM | POA: Diagnosis present

## 2015-02-27 DIAGNOSIS — Z3202 Encounter for pregnancy test, result negative: Secondary | ICD-10-CM | POA: Insufficient documentation

## 2015-02-27 DIAGNOSIS — J029 Acute pharyngitis, unspecified: Secondary | ICD-10-CM | POA: Diagnosis not present

## 2015-02-27 HISTORY — DX: Obesity, unspecified: E66.9

## 2015-02-27 LAB — URINE MICROSCOPIC-ADD ON

## 2015-02-27 LAB — URINALYSIS, ROUTINE W REFLEX MICROSCOPIC
BILIRUBIN URINE: NEGATIVE
Glucose, UA: NEGATIVE mg/dL
HGB URINE DIPSTICK: NEGATIVE
KETONES UR: NEGATIVE mg/dL
NITRITE: NEGATIVE
Protein, ur: NEGATIVE mg/dL
Specific Gravity, Urine: 1.024 (ref 1.005–1.030)
UROBILINOGEN UA: 0.2 mg/dL (ref 0.0–1.0)
pH: 6.5 (ref 5.0–8.0)

## 2015-02-27 LAB — WET PREP, GENITAL
Clue Cells Wet Prep HPF POC: NONE SEEN
Trich, Wet Prep: NONE SEEN
Yeast Wet Prep HPF POC: NONE SEEN

## 2015-02-27 LAB — POC URINE PREG, ED: PREG TEST UR: NEGATIVE

## 2015-02-27 MED ORDER — OXYMETAZOLINE HCL 0.05 % NA SOLN: 1.0000 | Freq: Two times a day (BID) | NASAL | Status: DC

## 2015-02-27 MED ORDER — DOXYCYCLINE HYCLATE 100 MG PO CAPS: 100.0000 mg | ORAL_CAPSULE | Freq: Two times a day (BID) | ORAL | Status: AC

## 2015-02-27 MED ORDER — DOXYCYCLINE HYCLATE 100 MG PO TABS
100.0000 mg | ORAL_TABLET | Freq: Once | ORAL | Status: AC
  Administered 2015-02-27: 100 mg via ORAL
  Filled 2015-02-27: qty 1

## 2015-02-27 MED ORDER — ALBUTEROL SULFATE HFA 108 (90 BASE) MCG/ACT IN AERS
2.0000 | INHALATION_SPRAY | Freq: Once | RESPIRATORY_TRACT | Status: AC
  Administered 2015-02-27: 2 via RESPIRATORY_TRACT
  Filled 2015-02-27: qty 6.7

## 2015-02-27 MED ORDER — AZITHROMYCIN 250 MG PO TABS
500.0000 mg | ORAL_TABLET | Freq: Once | ORAL | Status: AC
  Administered 2015-02-27: 500 mg via ORAL
  Filled 2015-02-27: qty 2

## 2015-02-27 MED ORDER — BENZONATATE 100 MG PO CAPS: 100.0000 mg | ORAL_CAPSULE | Freq: Three times a day (TID) | ORAL | Status: AC

## 2015-02-27 MED ORDER — BENZONATATE 100 MG PO CAPS
100.0000 mg | ORAL_CAPSULE | Freq: Once | ORAL | Status: AC
  Administered 2015-02-27: 100 mg via ORAL
  Filled 2015-02-27: qty 1

## 2015-02-27 MED ORDER — CEFTRIAXONE SODIUM 250 MG IJ SOLR
250.0000 mg | Freq: Once | INTRAMUSCULAR | Status: AC
Start: 2015-02-27 — End: 2015-02-27
  Administered 2015-02-27: 250 mg via INTRAMUSCULAR
  Filled 2015-02-27: qty 250

## 2015-02-27 MED ORDER — PSEUDOEPHEDRINE HCL 60 MG PO TABS: 60.0000 mg | ORAL_TABLET | ORAL | Status: DC | PRN

## 2015-02-27 NOTE — ED Provider Notes (Signed)
CSN: [id number]     Arrival date & time 02/27/15  1044 History   First MD Initiated Contact with Patient 02/27/15 1346     Chief Complaint  Patient presents with  . Headache  . Sore Throat     (Consider location/radiation/quality/duration/timing/severity/associated sxs/prior Treatment) HPI Comments: Patient presents with multiple complaints. She describes having a headache, sore throat, nasal congestion, sinus pressure, and productive cough for the past 1 week. No fevers or ear pain. No known sick contacts. Patient has not taken anything for her symptoms prior to arrival. She has had wheezing but no history of asthma. At the same time, patient describes onset of lower abdominal tenderness, and a vaginal discharge with odor. No bleeding. Patient is sexually active with one partner. No dysuria but increased frequency. Onset of symptoms acute. Course is constant. Nothing makes symptoms better or worse.  The history is provided by the patient.    Past Medical History  Diagnosis Date  . Obesity    History reviewed. No pertinent past surgical history. History reviewed. No pertinent family history. Social History  Substance Use Topics  . Smoking status: Former Smoker -- 0.50 packs/day    Types: Cigarettes  . Smokeless tobacco: None  . Alcohol Use: No   OB History    Gravida Para Term Preterm AB TAB SAB Ectopic Multiple Living   1              Review of Systems  Constitutional: Negative for fever, chills and fatigue.  HENT: Positive for congestion, rhinorrhea, sinus pressure and sore throat. Negative for ear pain.   Eyes: Negative for redness.  Respiratory: Negative for cough and wheezing.   Cardiovascular: Negative for chest pain.  Gastrointestinal: Positive for abdominal pain. Negative for nausea, vomiting and diarrhea.  Genitourinary: Positive for vaginal discharge. Negative for dysuria, frequency and vaginal bleeding.  Musculoskeletal: Negative for myalgias and neck stiffness.   Skin: Negative for rash.  Neurological: Positive for headaches.  Hematological: Negative for adenopathy.      Allergies  Review of patient's allergies indicates no known allergies.  Home Medications   Prior to Admission medications   Medication Sig Start Date End Date Taking? Authorizing Provider  amoxicillin (AMOXIL) 500 MG capsule Take 1 capsule (500 mg total) by mouth 3 (three) times daily. 08/25/14   Nicole Pisciotta, PA-C  azithromycin (ZITHROMAX) 250 MG tablet Take 1 tablet (250 mg total) by mouth daily. Take first 2 tablets together, then 1 every day until finished. 10/24/14   Gwyneth Sprout, MD  HYDROcodone-acetaminophen (NORCO/VICODIN) 5-325 MG per tablet Take 1-2 tablets by mouth every 6 (six) hours as needed. 10/24/14   Gwyneth Sprout, MD  ibuprofen (ADVIL,MOTRIN) 800 MG tablet Take 1 tablet (800 mg total) by mouth once. Patient taking differently: Take 800 mg by mouth daily as needed for moderate pain.  07/07/14   Joycie Peek, PA-C  metroNIDAZOLE (FLAGYL) 500 MG tablet Take 1 tablet (500 mg total) by mouth 2 (two) times daily. Patient not taking: Reported on 08/25/2014 07/05/14   Ames Dura, MD  Multiple Vitamin (MULTIVITAMIN WITH MINERALS) TABS tablet Take 1 tablet by mouth daily.    Historical Provider, MD  nitrofurantoin, macrocrystal-monohydrate, (MACROBID) 100 MG capsule Take 1 capsule (100 mg total) by mouth 2 (two) times daily. Patient not taking: Reported on 08/25/2014 07/05/14   Ames Dura, MD  oxyCODONE-acetaminophen (PERCOCET/ROXICET) 5-325 MG per tablet 1 to 2 tabs PO q6hrs  PRN for pain 08/25/14   Wynetta Emery, PA-C  Prenatal Vit-Fe  Fumarate-FA (PRENATAL MULTIVITAMIN) TABS tablet Take 1 tablet by mouth daily at 12 noon.    Historical Provider, MD   BP 113/75 mmHg  Pulse 94  Temp(Src) 98.2 F (36.8 C) (Oral)  Resp 18  Ht  (1.626 m)  Wt 210 lb (95.255 kg)  BMI 36.03 kg/m2  SpO2 99%  LMP 02/08/2015 Physical Exam  Constitutional: She appears  well-developed and well-nourished.  HENT:  Head: Normocephalic and atraumatic.  Right Ear: Tympanic membrane, external ear and ear canal normal.  Left Ear: Tympanic membrane, external ear and ear canal normal.  Nose: Mucosal edema (right greater than left) and rhinorrhea present.  Mouth/Throat: Uvula is midline, oropharynx is clear and moist and mucous membranes are normal. Mucous membranes are not dry. No oral lesions. No trismus in the jaw. No uvula swelling. No oropharyngeal exudate, posterior oropharyngeal edema, posterior oropharyngeal erythema or tonsillar abscesses.  Eyes: Conjunctivae are normal. Right eye exhibits no discharge. Left eye exhibits no discharge.  Neck: Normal range of motion. Neck supple.  Cardiovascular: Normal rate, regular rhythm and normal heart sounds.   No murmur heard. Pulmonary/Chest: Effort normal and breath sounds normal. No respiratory distress. She has no wheezes. She has no rales.  Abdominal: Soft. There is no tenderness.  Genitourinary: Uterus normal. There is no rash or tenderness on the right labia. There is no rash or tenderness on the left labia. Uterus is not tender. Cervix exhibits motion tenderness (mild). Cervix exhibits no discharge. Right adnexum displays no mass and no tenderness. Left adnexum displays no mass and no tenderness. No tenderness in the vagina. Vaginal discharge (White, mild) found.  Lymphadenopathy:    She has no cervical adenopathy.  Neurological: She is alert.  Skin: Skin is warm and dry.  Psychiatric: She has a normal mood and affect.  Nursing note and vitals reviewed.   ED Course  Procedures (including critical care time) Labs Review Labs Reviewed  WET PREP, GENITAL - Abnormal; Notable for the following:    WBC, Wet Prep HPF POC MANY (*)    All other components within normal limits  URINALYSIS, ROUTINE W REFLEX MICROSCOPIC (NOT AT [facility]) - Abnormal; Notable for the following:    Leukocytes, UA MODERATE (*)    All other  components within normal limits  URINE MICROSCOPIC-ADD ON - Abnormal; Notable for the following:    Squamous Epithelial / LPF FEW (*)    Bacteria, UA FEW (*)    All other components within normal limits  POC URINE PREG, ED  GC/CHLAMYDIA PROBE AMP ([facility]) NOT AT [facility]    Imaging Review No results found. I have personally reviewed and evaluated these images and lab results as part of my medical decision-making.   EKG Interpretation None       2:25 PM Patient seen and examined. Work-up initiated. Medications ordered.   Vital signs reviewed and are as follows: BP 113/75 mmHg  Pulse 94  Temp(Src) 98.2 F (36.8 C) (Oral)  Resp 18  Ht  (1.626 m)  Wt 210 lb (95.255 kg)  BMI 36.03 kg/m2  SpO2 99%  LMP 02/08/2015  Pelvic exam performed by [place] PA-S2 with chaperone in under my supervision.  Given a degree of cervical motion tenderness and lower abdominal pain, will treat as PID. Patient given Rocephin here, Doxy for home. This should also help with her possible sinusitis/bronchitis. Otherwise, symptomatic treatment indicated. Patient discharged home with Afrin, pseudoephedrine, Tessalon.  The patient was urged to return to the Emergency Department immediately  with worsening of current symptoms, worsening abdominal pain, persistent vomiting, blood noted in stools, fever, or any other concerns. The patient verbalized understanding.   Patient urged to return with worsening symptoms or other concerns. Patient verbalized understanding and agrees with plan.    MDM   Final diagnoses:  Bronchitis  Pelvic inflammatory disease   Bronchitis/URI: Patient with constellation of symptoms consistent with viral bronchitis/sinusitis. Symptomatic treatment as above. Given duration of symptoms of one-week, feel patient may benefit from antibiotics.  PID: Symptoms are mild but patient does have lower abdominal tenderness with cervical motion tenderness. This should be treated as PID  per CDC guidelines. Patient appears well, nontoxic. GC/Chlamydia pending.    Renne Crigler, PA-C 02/28/15 0801  Gerhard Munch, MD 02/28/15 [phone]

## 2015-02-27 NOTE — ED Notes (Signed)
Pt reports having a headache, sore throat and nausea x 1 week. Denies fever or vomiting, diarrhea.

## 2015-02-27 NOTE — Discharge Instructions (Signed)
Please read and follow all provided instructions.  Your diagnoses today include:  1. Bronchitis   2. Pelvic inflammatory disease     Tests performed today include:  Urine test to look for infection and pregnancy (in women)  Wet prep - shows possible vaginal infection  Vital signs. See below for your results today.   Medications prescribed:   Doxycycline - antibiotic  You have been prescribed an antibiotic medicine: take the entire course of medicine even if you are feeling better. Stopping early can cause the antibiotic not to work.   Oxymetazoline - nasal spray for congestion. Do not use for more than 3 days because this medicine can cause rebound congestion.    Tessalon Perles - cough suppressant medication   Albuterol inhaler - medication that opens up your airway  Use inhaler as follows: 1-2 puffs with spacer every 4 hours as needed for wheezing, cough, or shortness of breath.   Take any prescribed medications only as directed.  Home care instructions:   Follow any educational materials contained in this packet.  Follow-up instructions: Please follow-up with your primary care provider in the next 3 days for further evaluation of your symptoms.    Return instructions:  SEEK IMMEDIATE MEDICAL ATTENTION IF:  The pain does not go away or becomes severe   A temperature above 101F develops   Repeated vomiting occurs (multiple episodes)   The pain becomes localized to portions of the abdomen. The right side could possibly be appendicitis. In an adult, the left lower portion of the abdomen could be colitis or diverticulitis.   Blood is being passed in stools or vomit (bright red or black tarry stools)   You develop chest pain, difficulty breathing, dizziness or fainting, or become confused, poorly responsive, or inconsolable (young children)  If you have any other emergent concerns regarding your health  Additional Information: Abdominal (belly) pain can be  caused by many things. Your caregiver performed an examination and possibly ordered blood/urine tests and imaging (CT scan, x-rays, ultrasound). Many cases can be observed and treated at home after initial evaluation in the emergency department. Even though you are being discharged home, abdominal pain can be unpredictable. Therefore, you need a repeated exam if your pain does not resolve, returns, or worsens. Most patients with abdominal pain don't have to be admitted to the hospital or have surgery, but serious problems like appendicitis and gallbladder attacks can start out as nonspecific pain. Many abdominal conditions cannot be diagnosed in one visit, so follow-up evaluations are very important.  Your vital signs today were: BP 121/78 mmHg   Pulse 89   Temp(Src) 98.2 F (36.8 C) (Oral)   Resp 18   Ht  (1.626 m)   Wt 210 lb (95.255 kg)   BMI 36.03 kg/m2   SpO2 99%   LMP 02/08/2015 If your blood pressure (bp) was elevated above 135/85 this visit, please have this repeated by your doctor within one month. --------------

## 2015-02-28 LAB — GC/CHLAMYDIA PROBE AMP (~~LOC~~) NOT AT ARMC
CHLAMYDIA, DNA PROBE: NEGATIVE
Neisseria Gonorrhea: NEGATIVE

## 2015-03-23 ENCOUNTER — Encounter (HOSPITAL_COMMUNITY): Payer: Self-pay | Admitting: Emergency Medicine

## 2015-03-23 ENCOUNTER — Inpatient Hospital Stay (HOSPITAL_COMMUNITY): Payer: Medicaid Other

## 2015-03-23 ENCOUNTER — Emergency Department (HOSPITAL_COMMUNITY): Payer: Medicaid Other

## 2015-03-23 ENCOUNTER — Observation Stay (HOSPITAL_COMMUNITY)
Admission: EM | Admit: 2015-03-23 | Discharge: 2015-03-24 | Disposition: A | Discharge status: Home / Self Care | Payer: Medicaid Other | Attending: Cardiovascular Disease | Admitting: Cardiovascular Disease

## 2015-03-23 DIAGNOSIS — Z792 Long term (current) use of antibiotics: Secondary | ICD-10-CM | POA: Insufficient documentation

## 2015-03-23 DIAGNOSIS — R0789 Other chest pain: Secondary | ICD-10-CM

## 2015-03-23 DIAGNOSIS — Z23 Encounter for immunization: Secondary | ICD-10-CM | POA: Diagnosis not present

## 2015-03-23 DIAGNOSIS — R072 Precordial pain: Secondary | ICD-10-CM | POA: Diagnosis present

## 2015-03-23 DIAGNOSIS — Z79899 Other long term (current) drug therapy: Secondary | ICD-10-CM | POA: Insufficient documentation

## 2015-03-23 DIAGNOSIS — R61 Generalized hyperhidrosis: Secondary | ICD-10-CM | POA: Diagnosis not present

## 2015-03-23 DIAGNOSIS — R079 Chest pain, unspecified: Secondary | ICD-10-CM | POA: Diagnosis not present

## 2015-03-23 DIAGNOSIS — M549 Dorsalgia, unspecified: Secondary | ICD-10-CM | POA: Insufficient documentation

## 2015-03-23 DIAGNOSIS — E669 Obesity, unspecified: Secondary | ICD-10-CM | POA: Diagnosis not present

## 2015-03-23 DIAGNOSIS — Z8249 Family history of ischemic heart disease and other diseases of the circulatory system: Secondary | ICD-10-CM

## 2015-03-23 LAB — COMPREHENSIVE METABOLIC PANEL WITH GFR
ALT: 12 U/L — ABNORMAL LOW (ref 14–54)
AST: 17 U/L (ref 15–41)
Albumin: 3.8 g/dL (ref 3.5–5.0)
Alkaline Phosphatase: 90 U/L (ref 38–126)
Anion gap: 11 (ref 5–15)
BUN: 6 mg/dL (ref 6–20)
CO2: 22 mmol/L (ref 22–32)
Calcium: 9.5 mg/dL (ref 8.9–10.3)
Chloride: 106 mmol/L (ref 101–111)
Creatinine, Ser: 0.77 mg/dL (ref 0.44–1.00)
GFR calc Af Amer: >60 mL/min
GFR calc non Af Amer: >60 mL/min
Glucose, Bld: 103 mg/dL — ABNORMAL HIGH (ref 65–99)
Potassium: 4.2 mmol/L (ref 3.5–5.1)
Sodium: 139 mmol/L (ref 135–145)
Total Bilirubin: 0.4 mg/dL (ref 0.3–1.2)
Total Protein: 7.3 g/dL (ref 6.5–8.1)

## 2015-03-23 LAB — URINALYSIS, ROUTINE W REFLEX MICROSCOPIC
BILIRUBIN URINE: NEGATIVE
Bilirubin Urine: NEGATIVE
Glucose, UA: NEGATIVE mg/dL
Glucose, UA: NEGATIVE mg/dL
HGB URINE DIPSTICK: NEGATIVE
Hgb urine dipstick: NEGATIVE
Ketones, ur: NEGATIVE mg/dL
Ketones, ur: NEGATIVE mg/dL
Leukocytes, UA: NEGATIVE
NITRITE: NEGATIVE
Nitrite: NEGATIVE
PROTEIN: NEGATIVE mg/dL
Protein, ur: NEGATIVE mg/dL
Specific Gravity, Urine: 1.022 (ref 1.005–1.030)
Specific Gravity, Urine: 1.018 (ref 1.005–1.030)
UROBILINOGEN UA: 0.2 mg/dL (ref 0.0–1.0)
Urobilinogen, UA: 0.2 mg/dL (ref 0.0–1.0)
pH: 5.5 (ref 5.0–8.0)
pH: 5 (ref 5.0–8.0)

## 2015-03-23 LAB — TROPONIN I: Troponin I: 0.26 ng/mL — ABNORMAL HIGH

## 2015-03-23 LAB — RAPID URINE DRUG SCREEN, HOSP PERFORMED
Amphetamines: NOT DETECTED
BARBITURATES: NOT DETECTED
BENZODIAZEPINES: NOT DETECTED
COCAINE: NOT DETECTED
Opiates: NOT DETECTED
Tetrahydrocannabinol: POSITIVE — AB

## 2015-03-23 LAB — CBC
HCT: 42.3 % (ref 36.0–46.0)
HCT: 41.3 % (ref 36.0–46.0)
HEMOGLOBIN: 13.9 g/dL (ref 12.0–15.0)
Hemoglobin: 14.3 g/dL (ref 12.0–15.0)
MCH: 30.5 pg (ref 26.0–34.0)
MCH: 30 pg (ref 26.0–34.0)
MCHC: 33.8 g/dL (ref 30.0–36.0)
MCHC: 33.7 g/dL (ref 30.0–36.0)
MCV: 90.2 fL (ref 78.0–100.0)
MCV: 89 fL (ref 78.0–100.0)
Platelets: 263 10*3/uL (ref 150–400)
Platelets: 303 10*3/uL (ref 150–400)
RBC: 4.69 MIL/uL (ref 3.87–5.11)
RBC: 4.64 MIL/uL (ref 3.87–5.11)
RDW: 14.5 % (ref 11.5–15.5)
RDW: 14.5 % (ref 11.5–15.5)
WBC: 9.7 10*3/uL (ref 4.0–10.5)
WBC: 11.7 10*3/uL — ABNORMAL HIGH (ref 4.0–10.5)

## 2015-03-23 LAB — URINE MICROSCOPIC-ADD ON

## 2015-03-23 LAB — CREATININE, SERUM
CREATININE: 0.72 mg/dL (ref 0.44–1.00)
GFR calc Af Amer: >60 mL/min (ref >60)

## 2015-03-23 LAB — LIPASE, BLOOD: Lipase: 22 U/L (ref 11–51)

## 2015-03-23 LAB — BRAIN NATRIURETIC PEPTIDE: B Natriuretic Peptide: 19.7 pg/mL (ref 0.0–100.0)

## 2015-03-23 IMAGING — DX DG CHEST 2V
2 series · 2 of 2 positions shown · non-contrast
Comparison: [DATE]

CLINICAL DATA: Mid chest pain for 2 days with shortness of breath
and cough.

EXAM:
CHEST  2 VIEW

[chest pa]
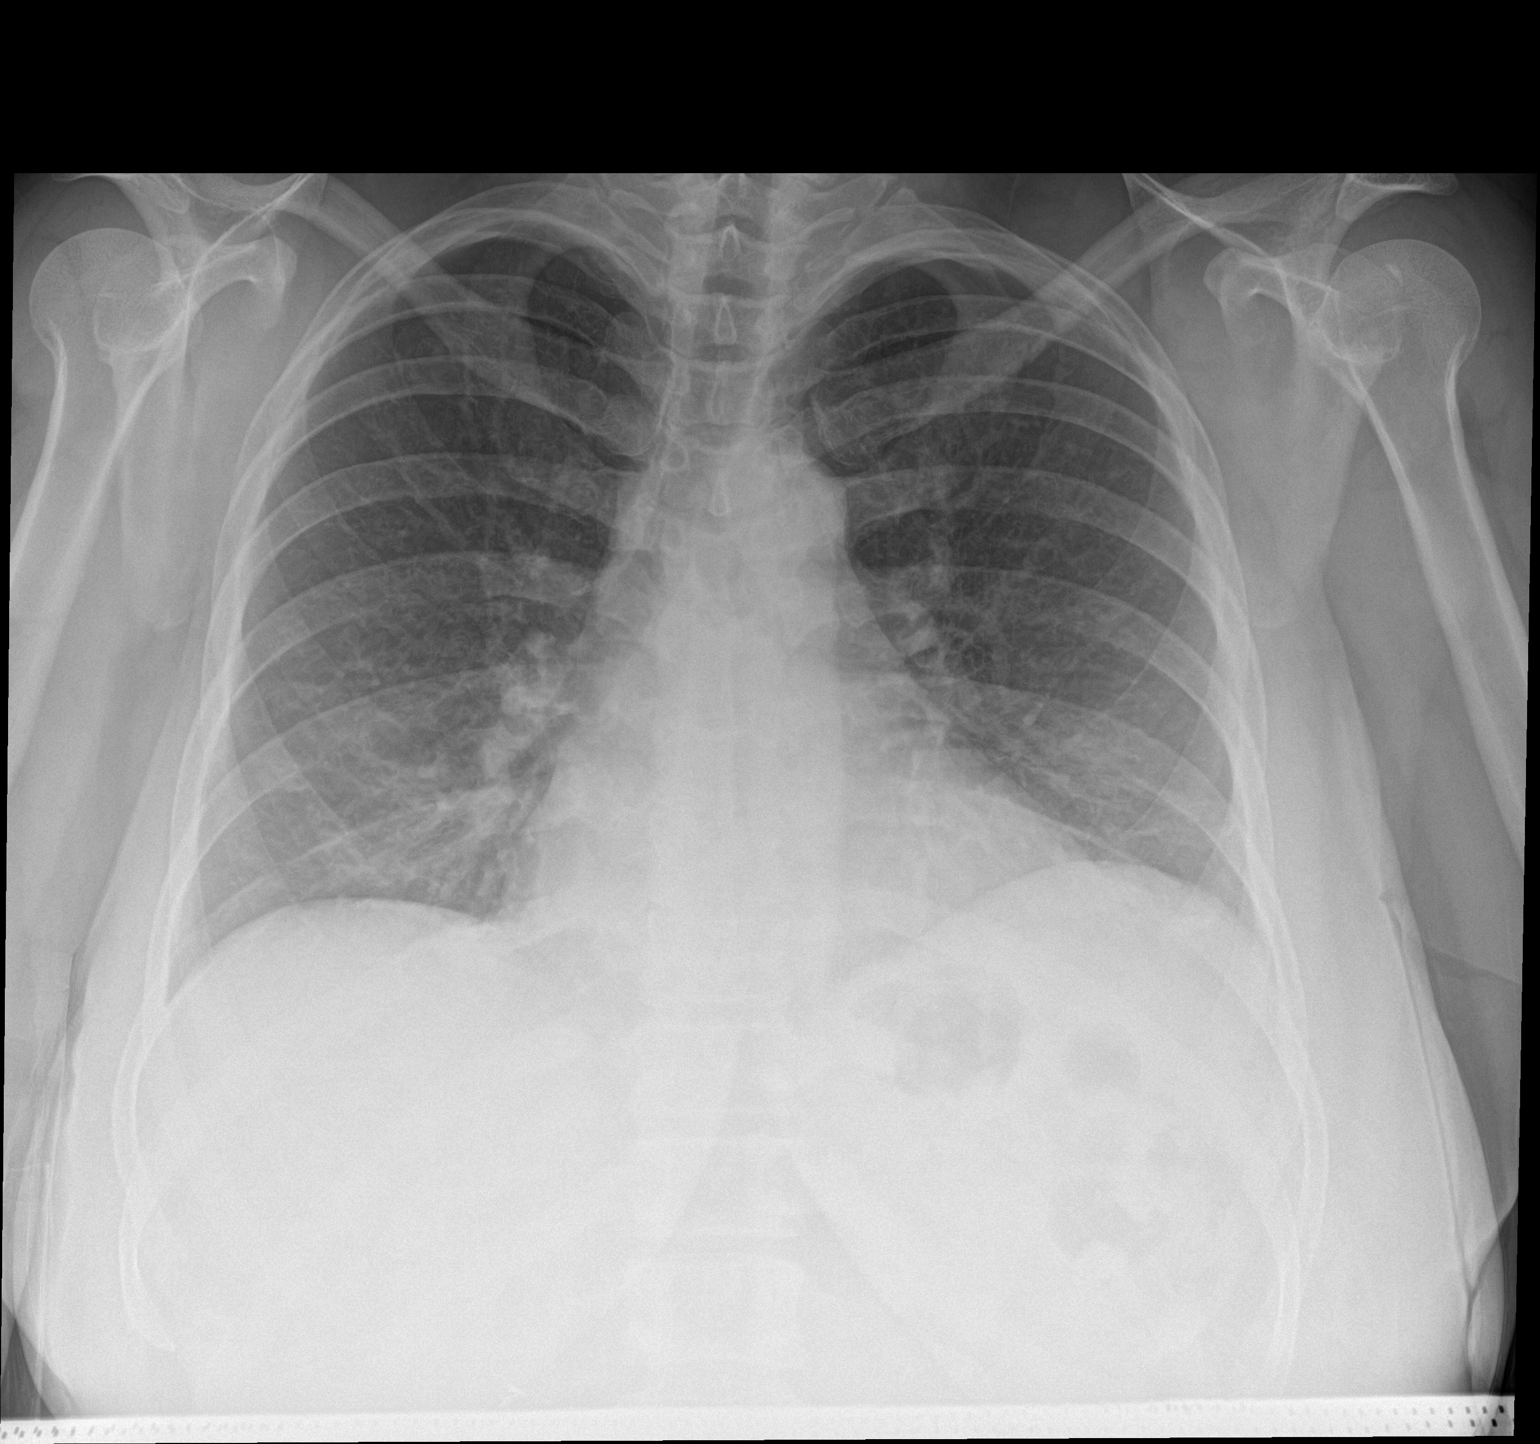

[chest lat]
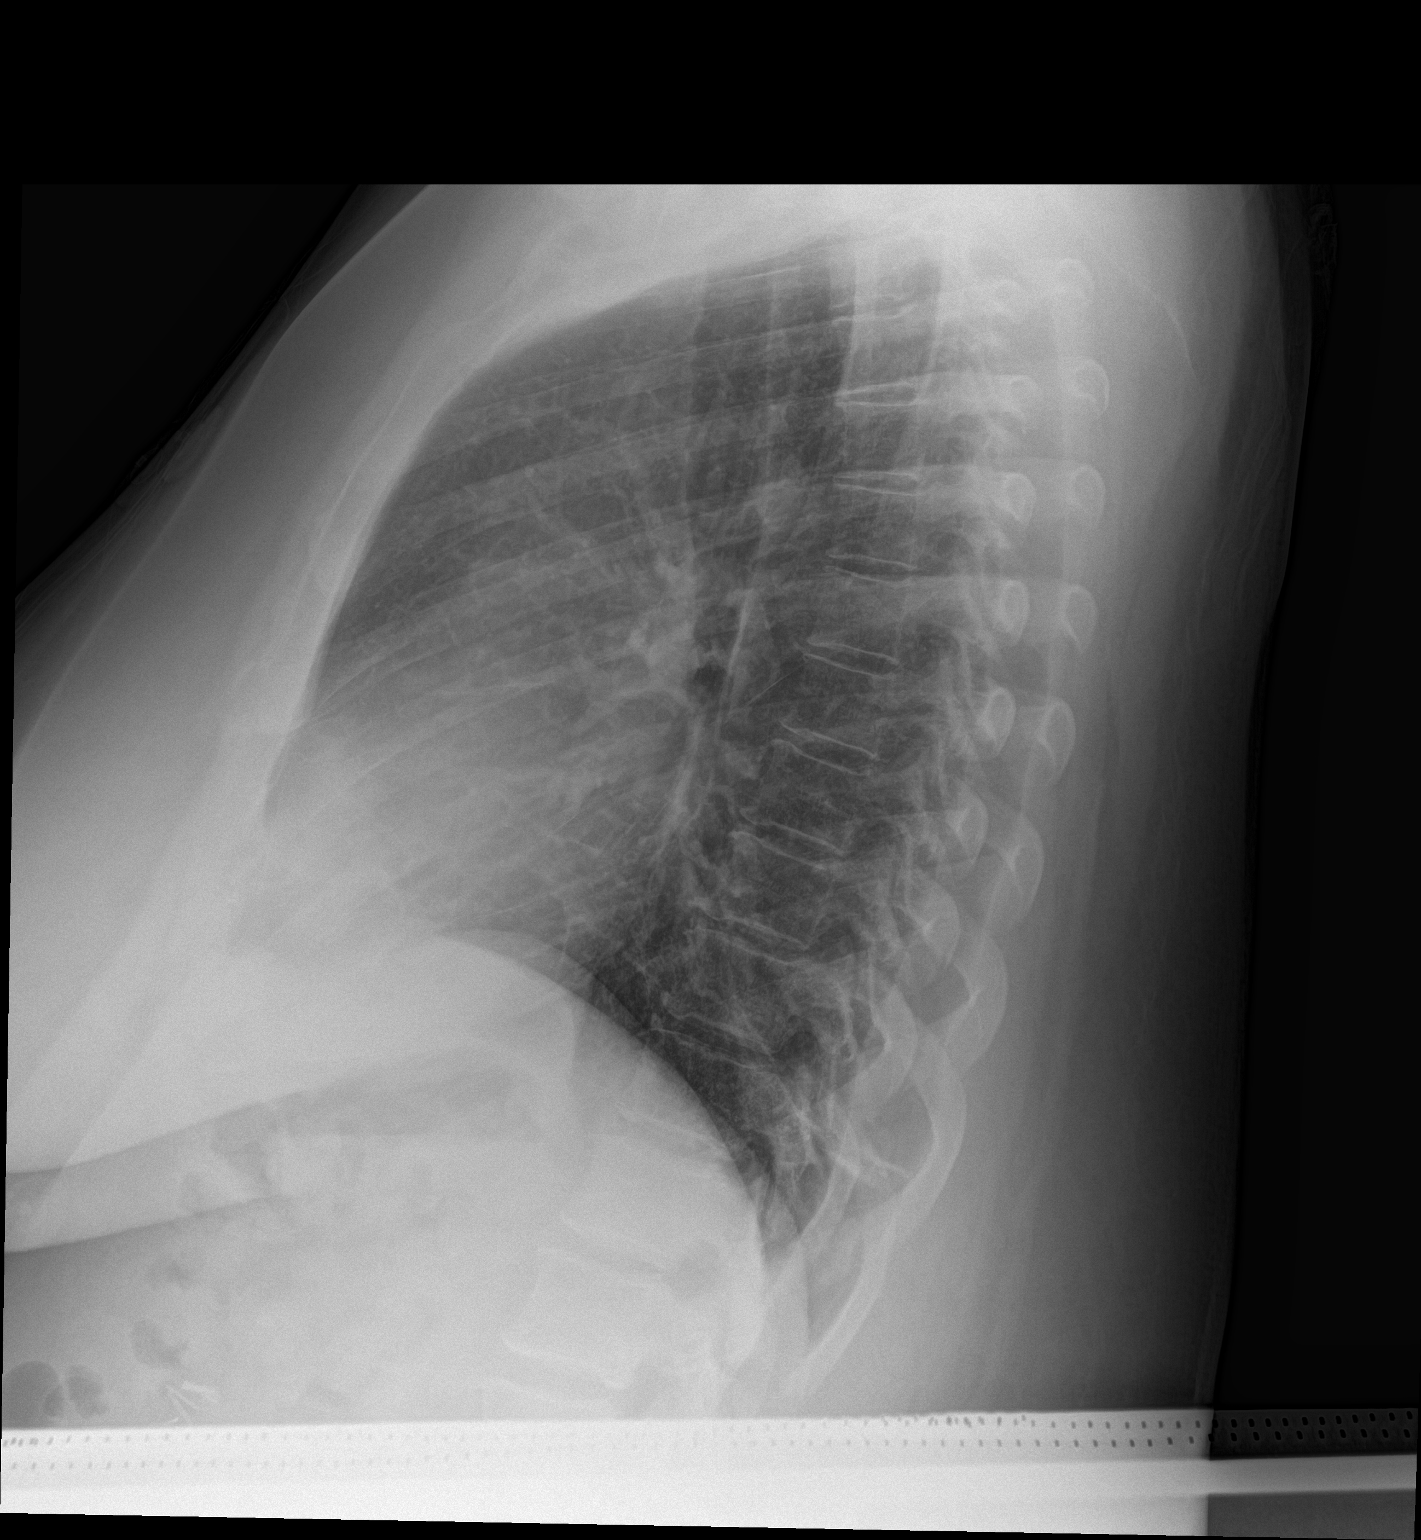

[2 of 2 positions shown; findings below may reference images not displayed]

FINDINGS: Cardiac silhouette is upper limits of normal in size. The patient
has taken a slightly shallower inspiration than on the prior study
and there are somewhat ill defined, hazy opacities in both lung
bases. No pleural effusion or pneumothorax is seen. No acute osseous
abnormality is identified. Right upper quadrant abdominal surgical
clips are noted.
IMPRESSION: Shallower inspiration with new, mild bibasilar opacities which may
reflect pneumonia or atelectasis.

## 2015-03-23 MED ORDER — PANTOPRAZOLE SODIUM 40 MG PO TBEC
40.0000 mg | DELAYED_RELEASE_TABLET | Freq: Every day | ORAL | Status: DC
  Administered 2015-03-24: 40 mg via ORAL
  Filled 2015-03-23: qty 1

## 2015-03-23 MED ORDER — BENZONATATE 100 MG PO CAPS
100.0000 mg | ORAL_CAPSULE | Freq: Three times a day (TID) | ORAL | Status: DC
  Administered 2015-03-23 – 2015-03-24 (×4): 100 mg via ORAL
  Filled 2015-03-23 (×4): qty 1

## 2015-03-23 MED ORDER — PNEUMOCOCCAL VAC POLYVALENT 25 MCG/0.5ML IJ INJ
0.5000 mL | INJECTION | INTRAMUSCULAR | Status: AC
  Administered 2015-03-24: 0.5 mL via INTRAMUSCULAR
  Filled 2015-03-23: qty 0.5

## 2015-03-23 MED ORDER — PANTOPRAZOLE SODIUM 40 MG IV SOLR
40.0000 mg | Freq: Once | INTRAVENOUS | Status: AC
  Administered 2015-03-23: 40 mg via INTRAVENOUS
  Filled 2015-03-23: qty 40

## 2015-03-23 MED ORDER — GI COCKTAIL ~~LOC~~
30.0000 mL | Freq: Once | ORAL | Status: AC
  Administered 2015-03-23: 30 mL via ORAL
  Filled 2015-03-23: qty 30

## 2015-03-23 MED ORDER — ENOXAPARIN SODIUM 40 MG/0.4ML ~~LOC~~ SOLN
40.0000 mg | SUBCUTANEOUS | Status: DC
  Administered 2015-03-23: 40 mg via SUBCUTANEOUS
  Filled 2015-03-23: qty 0.4

## 2015-03-23 MED ORDER — ENOXAPARIN SODIUM 40 MG/0.4ML ~~LOC~~ SOLN: 40.0000 mg | SUBCUTANEOUS | Status: DC

## 2015-03-23 MED ORDER — ALPRAZOLAM 0.5 MG PO TABS
0.5000 mg | ORAL_TABLET | Freq: Three times a day (TID) | ORAL | Status: DC | PRN
  Administered 2015-03-23 – 2015-03-24 (×3): 0.5 mg via ORAL
  Filled 2015-03-23 (×3): qty 1

## 2015-03-23 MED ORDER — MORPHINE SULFATE (PF) 4 MG/ML IV SOLN
4.0000 mg | Freq: Once | INTRAVENOUS | Status: AC
  Administered 2015-03-23: 4 mg via INTRAVENOUS
  Filled 2015-03-23: qty 1

## 2015-03-23 MED ORDER — POLYETHYLENE GLYCOL 3350 17 G PO PACK: 17.0000 g | PACK | Freq: Every day | ORAL | Status: DC | PRN

## 2015-03-23 MED ORDER — ASPIRIN EC 325 MG PO TBEC: 325.0000 mg | DELAYED_RELEASE_TABLET | Freq: Once | ORAL | Status: DC

## 2015-03-23 MED ORDER — ACETAMINOPHEN 325 MG PO TABS
650.0000 mg | ORAL_TABLET | ORAL | Status: DC | PRN
  Administered 2015-03-23 – 2015-03-24 (×2): 650 mg via ORAL
  Filled 2015-03-23 (×2): qty 2

## 2015-03-23 MED ORDER — ASPIRIN 81 MG PO CHEW
324.0000 mg | CHEWABLE_TABLET | Freq: Once | ORAL | Status: AC
  Administered 2015-03-23: 324 mg via ORAL
  Filled 2015-03-23: qty 4

## 2015-03-23 MED ORDER — INFLUENZA VAC SPLIT QUAD 0.5 ML IM SUSY
0.5000 mL | PREFILLED_SYRINGE | INTRAMUSCULAR | Status: AC
  Administered 2015-03-24: 0.5 mL via INTRAMUSCULAR

## 2015-03-23 MED ORDER — ASPIRIN EC 81 MG PO TBEC
81.0000 mg | DELAYED_RELEASE_TABLET | Freq: Every day | ORAL | Status: DC
  Administered 2015-03-24: 81 mg via ORAL
  Filled 2015-03-23: qty 1

## 2015-03-23 MED ORDER — ONDANSETRON HCL 4 MG/2ML IJ SOLN
4.0000 mg | Freq: Once | INTRAMUSCULAR | Status: AC
  Administered 2015-03-23: 4 mg via INTRAVENOUS
  Filled 2015-03-23: qty 2

## 2015-03-23 MED ORDER — SODIUM CHLORIDE 0.9 % IV BOLUS (SEPSIS)
1000.0000 mL | Freq: Once | INTRAVENOUS | Status: AC
  Administered 2015-03-23: 1000 mL via INTRAVENOUS

## 2015-03-23 MED ORDER — NITROGLYCERIN 0.4 MG SL SUBL: 0.4000 mg | SUBLINGUAL_TABLET | SUBLINGUAL | Status: DC | PRN

## 2015-03-23 MED ORDER — ZOLPIDEM TARTRATE 5 MG PO TABS
5.0000 mg | ORAL_TABLET | Freq: Every evening | ORAL | Status: DC | PRN
  Administered 2015-03-23: 5 mg via ORAL
  Filled 2015-03-23: qty 1

## 2015-03-23 MED ORDER — ONDANSETRON HCL 4 MG/2ML IJ SOLN: 4.0000 mg | Freq: Four times a day (QID) | INTRAMUSCULAR | Status: DC | PRN

## 2015-03-23 NOTE — ED Notes (Signed)
Pt here from home with c/o chest pain that starts in the epigastric area and radiates up to her chest and some to her back , no n/v or sob

## 2015-03-23 NOTE — ED Notes (Signed)
Patient transported to X-ray 

## 2015-03-23 NOTE — Progress Notes (Signed)
LCSW received call from MD regarding need for SW as patient reports her daughter is at school and no one will be home to get her. Daughter is at [employer], step father in room and LCSW will supply means of transportation home for stepfather and other child in room once patient has a bed upstairs.  All agreeable to plan.  Patient's daughter and patient want all children to be here with her.  LCSW explained policy and that is safest and healthier if children remain at home with family until she is discharged.  They are of course welcomed to visit, but should not be staying overnight in room as this is not part of policy or healthy for patient.  Patient agreeable to plan. Discussed with Cardiology team and RN in ED.  Deretha EmoryHannah Kirstein Baxley LCSW, MSW Clinical Social Work: Emergency Room [phone]

## 2015-03-23 NOTE — ED Provider Notes (Signed)
CSN: [id number]     Arrival date & time 03/23/15  0825 History   First MD Initiated Contact with Patient 03/23/15 [phone]     Chief Complaint  Patient presents with  . Chest Pain  . Back Pain    HPI   Katherine Goodman is a 44 y.o. female with no significant PMH who presents to ED for evaluation of chest pain. She states she was in her usual state of health until two days ago when she started experiencing burning substernal chest pain while she was resting watching TV. She states that the burning sensation comes and goes and is worse when laying down at night. She denies a history of reflux. Denies eating fatty foods. Denies eating right before bed. States she also feels like her chest muscles are sore which is not normal for her. She states the pain feels like it radiates to her RUQ/right flank. Denies SOB. Admits one episode of clammy hands/feet last night that occurred while she was resting watching TV as well. States the episode lasted ~5 min and resolved on its own. She has not tried anything to help the pain. Denies fever, chills, N/V/D. Denies urinary problems. She admits to smoking MJ daily and smokes one cigar daily. Denies EtOH or other drug use. States her mother died last year 2/2 MI.   Past Medical History  Diagnosis Date  . Obesity    History reviewed. No pertinent past surgical history. No family history on file. Social History  Substance Use Topics  . Smoking status: Current Some Day Smoker -- 0.50 packs/day    Types: Cigarettes  . Smokeless tobacco: None  . Alcohol Use: No   OB History    Gravida Para Term Preterm AB TAB SAB Ectopic Multiple Living   1              Review of Systems  All other systems reviewed and are negative.     Allergies  Review of patient's allergies indicates no known allergies.  Home Medications   Prior to Admission medications   Medication Sig Start Date End Date Taking? Authorizing Provider  benzonatate (TESSALON) 100 MG capsule Take 1  capsule (100 mg total) by mouth every 8 (eight) hours. 02/27/15  Yes Renne Crigler, PA-C  doxycycline (VIBRAMYCIN) 100 MG capsule Take 1 capsule (100 mg total) by mouth 2 (two) times daily. 02/27/15  Yes Renne Crigler, PA-C  Multiple Vitamin (MULTIVITAMIN WITH MINERALS) TABS tablet Take 1 tablet by mouth daily.   Yes Historical Provider, MD  oxymetazoline (AFRIN NASAL SPRAY) 0.05 % nasal spray Place 1 spray into both nostrils 2 (two) times daily. 02/27/15  Yes Renne Crigler, PA-C  pseudoephedrine (SUDAFED) 60 MG tablet Take 1 tablet (60 mg total) by mouth every 4 (four) hours as needed for congestion. 02/27/15  Yes Renne Crigler, PA-C  polyethylene glycol (MIRALAX / GLYCOLAX) packet Take 17 g by mouth daily as needed for moderate constipation.    Historical Provider, MD   BP 138/79 mmHg  Pulse 104  Temp(Src) 98.7 F (37.1 C) (Oral)  Resp 18  SpO2 100%  LMP 02/08/2015 Physical Exam  Constitutional: She is oriented to person, place, and time.  Obese. NAD.  HENT:  Right Ear: External ear normal.  Left Ear: External ear normal.  Nose: Nose normal.  Mouth/Throat: Oropharynx is clear and moist. No oropharyngeal exudate.  Eyes: Conjunctivae and EOM are normal. Pupils are equal, round, and reactive to light.  Neck: Normal range of motion. Neck supple.  Cardiovascular: Normal rate, regular rhythm, normal heart sounds and intact distal pulses.   No murmur heard. Pulmonary/Chest: Effort normal and breath sounds normal. No respiratory distress. She has no wheezes. She exhibits tenderness.  Chest mildly tender to palpation along sternum  Abdominal: Soft. Bowel sounds are normal. There is no tenderness. There is no CVA tenderness, no tenderness at McBurney's point and negative Murphy's sign.  Nontender in any quadrant.  Musculoskeletal: Normal range of motion. She exhibits no edema.  Lymphadenopathy:    She has no cervical adenopathy.  Neurological: She is alert and oriented to person, place, and  time. No cranial nerve deficit.  Skin: Skin is warm and dry.  Nursing note and vitals reviewed.   ED Course  Procedures (including critical care time) Labs Review Labs Reviewed  COMPREHENSIVE METABOLIC PANEL - Abnormal; Notable for the following:    Glucose, Bld 103 (*)    ALT 12 (*)    All other components within normal limits  TROPONIN I - Abnormal; Notable for the following:    Troponin I 0.26 (*)    All other components within normal limits  URINALYSIS, ROUTINE W REFLEX MICROSCOPIC (NOT AT [facility]) - Abnormal; Notable for the following:    APPearance CLOUDY (*)    Leukocytes, UA MODERATE (*)    All other components within normal limits  URINE MICROSCOPIC-ADD ON - Abnormal; Notable for the following:    Squamous Epithelial / LPF MANY (*)    All other components within normal limits  CBC  LIPASE, BLOOD  BRAIN NATRIURETIC PEPTIDE  URINE RAPID DRUG SCREEN, HOSP PERFORMED    Imaging Review Dg Chest 2 View  03/23/2015  CLINICAL DATA:  Mid chest pain for 2 days with shortness of breath and cough. EXAM: CHEST  2 VIEW COMPARISON:  10/24/2014 FINDINGS: Cardiac silhouette is upper limits of normal in size. The patient has taken a slightly shallower inspiration than on the prior study and there are somewhat ill defined, hazy opacities in both lung bases. No pleural effusion or pneumothorax is seen. No acute osseous abnormality is identified. Right upper quadrant abdominal surgical clips are noted. IMPRESSION: Shallower inspiration with new, mild bibasilar opacities which may reflect pneumonia or atelectasis. Electronically Signed   By: Sebastian AcheAllen  Grady M.D.   On: 03/23/2015 09:50   I have personally reviewed and evaluated these images and lab results as part of my medical decision-making.   EKG Interpretation   Date/Time:  Thursday March 23 2015 08:33:06 EDT Ventricular Rate:  97 PR Interval:  116 QRS Duration: 73 QT Interval:  351 QTC Calculation: 446 R Axis:   53 Text  Interpretation:  Sinus rhythm Borderline short PR interval  Anteroseptal infarct, old Baseline wander in lead(s) V4 No old tracing to  compare Confirmed by Mirian MoGentry, Matthew [phone]([id number]) on 03/23/2015 9:17:49 AM      MDM   Final diagnoses:  None    EKG obtained in triage unremarkable. However will continue with chest pain workup as cannot r/o ACS at this time. Pt's story still slightly concerning given episode of sweats (though not true diaphoresis), body habitus, tobacco/MJ use, and FMH. I do think it is more likely this is GI/GERD related since the pt describes it as burning that is worse when laying down at night and HEART score is 2 pending troponin. Will check trop, get CXR. Will also check cbc, cmp, lipase, bnp, and UA. Giving GI cocktail.   Pain initially improved w/ GI cocktail but coming back now. Will give  IV Protonix while we wait for bloodwork. CXR shows possible pneumonia vs atelectasis. UA shows leuks with squamous epithelial cells.   Bloodwork shows elevated trop 0.26. Discussed with pt, she denies crack/cocaine use. UDS pending. Cards notified of pt. Will wait for UDS results. In the interim,  ASA and morphine/zofran given.   Discussed pt with cards. They will cycle troponin overnight and obtain echo. Pending results, will stress test / cath.   Carlene Coria, PA-C 03/23/15 1319  Mirian Mo, MD 03/29/15 [phone]

## 2015-03-23 NOTE — Progress Notes (Signed)
Patient states she wants something to help her sleep. Dr. Erma HeritageAlvaraz was paged to request sleep pill. [facility] [employer]

## 2015-03-23 NOTE — ED Notes (Signed)
Report called  

## 2015-03-23 NOTE — Progress Notes (Signed)
Spoke with pt/RN earlier this pm re: pt's children.  Pt's children left ED today with pt's friend and escorted home.  However, pt's friend did not stay with children and they were home alone.  Pt very concerned that her kids were alone because of all the crime/break-ins in their apartment complex. RN was concerned that pt would leave AMA in order to be with her kids and she needed to remain IP for cardiac monitoring.  Pt could not identify anyone who could transport her kids to hospital, as all her family were in [place].  CSW spoke with Lt. Andria MeuseStevens of [employer] (off-duty, [place]) who spoke with [employer] supervisor in [place] who agreed to send police car to retrieve children.  Pt agreeable to plan.  CSW escorted children from ED to pt's room after they were dropped off in the ED by [employer].  CSW provided happy meals to children and emotional support to pt.  Pt happy that her children were with her and appreciative of CSW's efforts.  Per pt, her father/sister will be coming from [place] to be with children on Friday 10/28.  Unit CSW will be available for f/u, as necessary.  Katherine SavoyJody Tal Kempker, LCSW Evening/ED Coverage [phone]

## 2015-03-23 NOTE — Progress Notes (Signed)
  Echocardiogram 2D Echocardiogram has been performed.  Arvil ChacoFoster, Lewin Pellow 03/23/2015, 4:26 PM

## 2015-03-23 NOTE — Progress Notes (Addendum)
   Second troponin is negative. Will not start Heparin, BB, or statin at this time. Will continue to monitor troponin values and review echocardiogram results once obtained in setting of atypical chest pain.  Signed, Ellsworth LennoxBrittany M Aliha Diedrich, PA-C 03/23/2015, 3:22 PM Pager: [phone]

## 2015-03-23 NOTE — H&P (Signed)
Patient ID: Katherine Goodman MRN: [id number], DOB/AGE: 01-24-1971   Admit date: 03/23/2015  Primary Physician: No primary care provider on file. Primary Cardiologist: New  HPI: Katherine Goodman is 44 y.o. with past medical history of hernia repair and no significant cardiac history who presents to [facility] ED on 03/23/2015 for worsening chest pressure over the past two days.   Patient reports the pain started at rest on 03/21/2015 and was a pressure along her sternum. She thought it might be heartburn and took some Mylanta and had relief for approximately 30 minutes, then the pain represented. She reports it comes and goes, lasting for 5-10 minutes then not coming back for 5-6 hours. She has not noticed an association of the pain with physical activity. When asked to quantify the pain, she says it is just "pressure" and says it feels different from her usual heartburn. She does report the pain became worse last night when she was lying down.   Reports having sweating in her hands and feet last night during one of the episodes but denies overall diaphoresis. No nausea, vomiting, or radiating pain. She denies having any chest pain prior to this episode. Reports walking long distances to the bus stop daily and taking care of her children. She denies any dyspnea with these activities. Reports using Marijuana daily and smoking a cigar daily, saying those have not influenced her pain. Denies any other drug use. Family history is significant for her mother having CABG at age 48.   While in the ED, her BNP, Lipase, CMP and CBC were normal with slightly elevated glucose. Troponin was elevated at 0.26. UDS positive for THC. UA showed moderate leukocytes but had many squamous epithelial cells likely indicating a contaminated sample. She is not reporting any urine symptoms at this time. CXR showed mild bibasilar opacities which may be indicative of pneumonia or atelectasis.  Problem List: Past Medical  History  Diagnosis Date  . Obesity     Past Surgical History  Procedure Laterality Date  . Hernia repair  2010      Home Medications: Prior to Admission medications   Medication Sig Start Date End Date Taking? Authorizing Provider  benzonatate (TESSALON) 100 MG capsule Take 1 capsule (100 mg total) by mouth every 8 (eight) hours. 02/27/15  Yes Renne Crigler, PA-C  doxycycline (VIBRAMYCIN) 100 MG capsule Take 1 capsule (100 mg total) by mouth 2 (two) times daily. 02/27/15  Yes Renne Crigler, PA-C  Multiple Vitamin (MULTIVITAMIN WITH MINERALS) TABS tablet Take 1 tablet by mouth daily.   Yes Historical Provider, MD  oxymetazoline (AFRIN NASAL SPRAY) 0.05 % nasal spray Place 1 spray into both nostrils 2 (two) times daily. 02/27/15  Yes Renne Crigler, PA-C  pseudoephedrine (SUDAFED) 60 MG tablet Take 1 tablet (60 mg total) by mouth every 4 (four) hours as needed for congestion. 02/27/15  Yes Renne Crigler, PA-C  polyethylene glycol (MIRALAX / GLYCOLAX) packet Take 17 g by mouth daily as needed for moderate constipation.    Historical Provider, MD    Allergies: No Known Allergies  Past Medical History: Past Medical History  Diagnosis Date  . Obesity      Surgical History: Past Surgical History  Procedure Laterality Date  . Hernia repair  2010     Family History: Family History  Problem Relation Age of Onset  . Diabetes Mother   . Hypertension Mother   . Hypertension Sister   . Heart attack Mother     Social History:  Social History   Social History  . Marital Status: Single    Spouse Name: N/A  . Number of Children: N/A  . Years of Education: N/A   Occupational History  . Not on file.   Social History Main Topics  . Smoking status: Current Some Day Smoker -- 0.50 packs/day    Types: Cigarettes, Cigars  . Smokeless tobacco: Not on file     Comment: 1 Black and Mild per day  . Alcohol Use: No  . Drug Use: Yes     Comment: THC - daily  . Sexual Activity: Not on file    Other Topics Concern  . Not on file   Social History Narrative   Stay at home mom.     Review of Systems General:  No chills, fever, night sweats or weight changes.  Cardiovascular:  No dyspnea on exertion, edema, orthopnea, palpitations, paroxysmal nocturnal dyspnea.  Positive for chest pain. Dermatological: No rash, lesions/masses Respiratory: No cough, dyspnea Urologic: No hematuria, dysuria Abdominal:   No nausea, vomiting, diarrhea, bright red blood per rectum, melena, or hematemesis Neurologic:  No visual changes, wkns, changes in mental status. All other systems reviewed and are otherwise negative except as noted above.  Physical Exam: Blood pressure 124/59, pulse 84, temperature 98.1 F (36.7 C), temperature source Oral, resp. rate 27, last menstrual period 02/08/2015, SpO2 98 %.  General: Well developed, well nourished [place] ,female in no acute distress. Head: Normocephalic, atraumatic, sclera non-icteric, no xanthomas, nares are without discharge. Dentition:  Neck: No carotid bruits. JVD not elevated.  Lungs: Respirations regular and unlabored, without wheezes or rales.  Heart: Regular rate and rhythm. No S3 or S4.  No murmur, no rubs, or gallops appreciated. Tender to palpation along sternum. Abdomen: Soft, non-tender, non-distended with normoactive bowel sounds. No hepatomegaly. No rebound/guarding. No obvious abdominal masses. Msk:  Strength and tone appear normal for age. No joint deformities or effusions. Extremities: No clubbing or cyanosis. No edema.  Distal pedal pulses are 2+ bilaterally. Neuro: Alert and oriented X 3. Moves all extremities spontaneously. No focal deficits noted. Psych:  Responds to questions appropriately with a normal affect. Skin: No rashes or lesions noted   Labs Lab Results  Component Value Date   WBC 9.7 03/23/2015   HGB 14.3 03/23/2015   HCT 42.3 03/23/2015   MCV 90.2 03/23/2015   PLT 263 03/23/2015     Recent  Labs Lab 03/23/15 0905  NA 139  K 4.2  CL 106  CO2 22  BUN 6  CREATININE 0.77  CALCIUM 9.5  PROT 7.3  BILITOT 0.4  ALKPHOS 90  ALT 12*  AST 17  GLUCOSE 103*   Troponin (Point of Care Test) Recent Labs  03/23/15 0905  TROPONINI 0.26*   B NATRIURETIC PEPTIDE  Date/Time Value Ref Range Status  03/23/2015 09:05 AM 19.7 0.0 - 100.0 pg/mL Final    ECG: NSR, rate in 90's. No significant ST or T-wave abnormalities.  Echo: None on File  Radiology/Studies: Dg Chest 2 View: 03/23/2015  CLINICAL DATA:  Mid chest pain for 2 days with shortness of breath and cough. EXAM: CHEST  2 VIEW COMPARISON:  10/24/2014 FINDINGS: Cardiac silhouette is upper limits of normal in size. The patient has taken a slightly shallower inspiration than on the prior study and there are somewhat ill defined, hazy opacities in both lung bases. No pleural effusion or pneumothorax is seen. No acute osseous abnormality is identified. Right upper quadrant abdominal surgical clips are  noted. IMPRESSION: Shallower inspiration with new, mild bibasilar opacities which may reflect pneumonia or atelectasis. Electronically Signed   By: Sebastian AcheAllen  Grady M.D.   On: 03/23/2015 09:50    ASSESSMENT AND PLAN:  1. Chest Pain with atypical features - episodes of pain lasting 5-10 minutes, then representing 5-6 hours later. Not associated with exertion. Relieved with rest and partially relieved with Mylanta, however patient says this feels different from her "usual heartburn". Is tender on palpation which is very atypical of cardiac etiology. - initial troponin elevated to 0.26. Will cycle troponin values overnight. - obtain echocardiogram. If enzymes do not trend up, will plan on obtaining exercise stress test in the morning. If enzymes do trend up and echo is abnormal, then may need potential cath. - will start on Protonix for potential GERD. - Hold heparin, BB, and statin for now pending second troponin value. Will start if enzyme  elevation occurs.  2. Abnormal Urinalysis - UA showed moderate leukocytes but had many squamous epithelial cells likely indicating a contaminated sample. - will obtain clean catch urine. - asymptomatic at this time.   Signed, Katherine LennoxBrittany M Strader, PA-C 03/23/2015, 12:03 PM Pager: [phone]    Patient examined chart reviewed.  Obese black female with atypical pains.  ECG non acute mild elevation in Troponin.  Smokes cigarettes and marijuana.  Check echo and f/u troponin if abnormal plan cath in am And start heparin. If troponin flat and echo normal will proceed with exercise myovue in am.  Needs repeat Clean catch urine and IS for atelectasis f/u CXR with good inspiration in am.    Charlton HawsPeter Dmiyah Goodman

## 2015-03-23 NOTE — ED Notes (Signed)
cardiology PA at bedside  

## 2015-03-24 ENCOUNTER — Inpatient Hospital Stay (HOSPITAL_COMMUNITY): Payer: Medicaid Other

## 2015-03-24 DIAGNOSIS — R072 Precordial pain: Secondary | ICD-10-CM | POA: Diagnosis present

## 2015-03-24 DIAGNOSIS — M549 Dorsalgia, unspecified: Secondary | ICD-10-CM | POA: Diagnosis not present

## 2015-03-24 DIAGNOSIS — R0789 Other chest pain: Secondary | ICD-10-CM

## 2015-03-24 DIAGNOSIS — R61 Generalized hyperhidrosis: Secondary | ICD-10-CM | POA: Diagnosis not present

## 2015-03-24 DIAGNOSIS — E669 Obesity, unspecified: Secondary | ICD-10-CM | POA: Diagnosis not present

## 2015-03-24 DIAGNOSIS — R079 Chest pain, unspecified: Secondary | ICD-10-CM | POA: Diagnosis not present

## 2015-03-24 LAB — NM MYOCAR MULTI W/SPECT W/WALL MOTION / EF
CHL CUP MPHR: 179 {beats}/min
CHL CUP RESTING HR STRESS: 78 {beats}/min
CSEPHR: 95 %
Estimated workload: 8.6 METS
Exercise duration (min): 7 min
Exercise duration (sec): 0 s
Peak HR: 171 {beats}/min

## 2015-03-24 LAB — CBC
HCT: 40.6 % (ref 36.0–46.0)
HEMOGLOBIN: 13.6 g/dL (ref 12.0–15.0)
MCH: 29.9 pg (ref 26.0–34.0)
MCHC: 33.5 g/dL (ref 30.0–36.0)
MCV: 89.2 fL (ref 78.0–100.0)
Platelets: 277 10*3/uL (ref 150–400)
RBC: 4.55 MIL/uL (ref 3.87–5.11)
RDW: 14.3 % (ref 11.5–15.5)
WBC: 10.9 10*3/uL — ABNORMAL HIGH (ref 4.0–10.5)

## 2015-03-24 LAB — LIPID PANEL
CHOLESTEROL: 160 mg/dL (ref 0–200)
HDL: 40 mg/dL — ABNORMAL LOW (ref >40)
LDL CALC: 100 mg/dL — AB (ref 0–99)
TRIGLYCERIDES: 101 mg/dL (ref <150)
Total CHOL/HDL Ratio: 4 RATIO
VLDL: 20 mg/dL (ref 0–40)

## 2015-03-24 LAB — BASIC METABOLIC PANEL
Anion gap: 11 (ref 5–15)
BUN: 7 mg/dL (ref 6–20)
CALCIUM: 9.6 mg/dL (ref 8.9–10.3)
CO2: 22 mmol/L (ref 22–32)
CREATININE: 0.79 mg/dL (ref 0.44–1.00)
Chloride: 107 mmol/L (ref 101–111)
GFR calc non Af Amer: >60 mL/min (ref >60)
Glucose, Bld: 97 mg/dL (ref 65–99)
Potassium: 4.3 mmol/L (ref 3.5–5.1)
SODIUM: 140 mmol/L (ref 135–145)

## 2015-03-24 LAB — PROTIME-INR
INR: 1.14 (ref 0.00–1.49)
PROTHROMBIN TIME: 14.8 s (ref 11.6–15.2)

## 2015-03-24 LAB — TROPONIN I

## 2015-03-24 MED ORDER — TECHNETIUM TC 99M SESTAMIBI - CARDIOLITE
30.0000 | Freq: Once | INTRAVENOUS | Status: AC | PRN
  Administered 2015-03-24: 10:00:00 30 via INTRAVENOUS

## 2015-03-24 MED ORDER — TECHNETIUM TC 99M SESTAMIBI GENERIC - CARDIOLITE
10.0000 | Freq: Once | INTRAVENOUS | Status: AC | PRN
  Administered 2015-03-24: 10 via INTRAVENOUS

## 2015-03-24 NOTE — Discharge Instructions (Signed)
Please follow-up with your Primary Care M.D.

## 2015-03-24 NOTE — Progress Notes (Signed)
Patient ID: Katherine Goodman, female   DOB: 09/29/1970, 44 y.o.   MRN: [id number]    Subjective:  Denies SSCP, palpitations or Dyspnea Seen in nuclear   Objective:  Filed Vitals:   03/23/15 1339 03/23/15 2118 03/24/15 0628 03/24/15 0945  BP: 104/51 113/48 105/62 135/79  Pulse:  67 74 71  Temp: 98.6 F (37 C) 98.4 F (36.9 C) 98.3 F (36.8 C)   TempSrc: Oral Oral Oral   Resp: 20 18 18    Height: 5\' 4"  (1.626 m)     Weight: 107.14 kg (236 lb 3.2 oz)  108 kg (238 lb 1.6 oz)   SpO2: 96% 99% 100%     Intake/Output from previous day:  Intake/Output Summary (Last 24 hours) at 03/24/15 0947 Last data filed at 03/23/15 2129  Gross per 24 hour  Intake    480 ml  Output   1050 ml  Net   -570 ml    Physical Exam: Affect appropriate Obese black female  HEENT: normal Neck supple with no adenopathy JVP normal no bruits no thyromegaly Lungs clear with no wheezing and good diaphragmatic motion Heart:  S1/S2 no murmur, no rub, gallop or click PMI normal Abdomen: benighn, BS positve, no tenderness, no AAA no bruit.  No HSM or HJR Distal pulses intact with no bruits No edema Neuro non-focal Skin warm and dry No muscular weakness   Lab Results: Basic Metabolic Panel:  Recent Labs  78/29/5610/27/16 0905 03/23/15 1401 03/24/15 0136  NA 139  --  140  K 4.2  --  4.3  CL 106  --  107  CO2 22  --  22  GLUCOSE 103*  --  97  BUN 6  --  7  CREATININE 0.77 0.72 0.79  CALCIUM 9.5  --  9.6   Liver Function Tests:  Recent Labs  03/23/15 0905  AST 17  ALT 12*  ALKPHOS 90  BILITOT 0.4  PROT 7.3  ALBUMIN 3.8    Recent Labs  03/23/15 0905  LIPASE 22   CBC:  Recent Labs  03/23/15 1401 03/24/15 0136  WBC 11.7* 10.9*  HGB 13.9 13.6  HCT 41.3 40.6  MCV 89.0 89.2  PLT 303 277   Cardiac Enzymes:  Recent Labs  03/23/15 1401 03/23/15 1923 03/24/15 0136  TROPONINI <0.03 <0.03 <0.03   Fasting Lipid Panel:  Recent Labs  03/24/15 0136  CHOL 160  HDL 40*  LDLCALC  100*  TRIG 101  CHOLHDL 4.0    Imaging: Dg Chest 2 View  03/24/2015  CLINICAL DATA:  Chest pain EXAM: CHEST  2 VIEW COMPARISON:  03/23/2015 FINDINGS: Borderline cardiomegaly. Normal vascularity. Lungs are grossly clear a and mildly under aerated. No pneumothorax. IMPRESSION: Borderline cardiomegaly without decompensation. Electronically Signed   By: Jolaine ClickArthur  Hoss M.D.   On: 03/24/2015 08:02   Dg Chest 2 View  03/23/2015  CLINICAL DATA:  Mid chest pain for 2 days with shortness of breath and cough. EXAM: CHEST  2 VIEW COMPARISON:  10/24/2014 FINDINGS: Cardiac silhouette is upper limits of normal in size. The patient has taken a slightly shallower inspiration than on the prior study and there are somewhat ill defined, hazy opacities in both lung bases. No pleural effusion or pneumothorax is seen. No acute osseous abnormality is identified. Right upper quadrant abdominal surgical clips are noted. IMPRESSION: Shallower inspiration with new, mild bibasilar opacities which may reflect pneumonia or atelectasis. Electronically Signed   By: Sebastian AcheAllen  Grady M.D.   On: 03/23/2015 09:50  Cardiac Studies:  ECG:  NSR no acute changes   Telemetry:  NSR no arrhythmia   Echo:   EF 60-65% normal   Medications:   . aspirin EC  81 mg Oral Daily  . benzonatate  100 mg Oral Q8H  . enoxaparin (LOVENOX) injection  40 mg Subcutaneous Q24H  . Influenza vac split quadrivalent PF  0.5 mL Intramuscular Tomorrow-1000  . pantoprazole  40 mg Oral Daily  . pneumococcal 23 valent vaccine  0.5 mL Intramuscular Tomorrow-1000       Assessment/Plan:  Chest Pain: Atypical r/o normal echo for myovue this am D/C home if normal. Seems to have some social stresses Just moved to [facility] and has  No support She indicated need to move back to [facility].  Her kids are actually in [employer] as she had no one to bring them to school  GERD:  Obesity needs low carb diet continue pantoprazole  Charlton Haws 03/24/2015, 9:47  AM

## 2015-03-24 NOTE — Progress Notes (Signed)
GXT MV performed. 

## 2015-03-24 NOTE — Progress Notes (Signed)
Patient Name: Katherine KellsCassandra Goodman Date of Encounter: 03/24/2015  Principal Problem:   Chest pain of uncertain etiology Active Problems:   Family history of cardiac disorder in mother   Primary Cardiologist: Dr Eden EmmsNishan  Patient Profile: 44 yo female w/ no hx CAD, admitted 10/27 for chest pain.  SUBJECTIVE: No more chest pain, no SOB. Children are safe with family.   OBJECTIVE Filed Vitals:   03/23/15 1230 03/23/15 1339 03/23/15 2118 03/24/15 0628  BP: 103/44 104/51 113/48 105/62  Pulse: 68  67 74  Temp:  98.6 F (37 C) 98.4 F (36.9 C) 98.3 F (36.8 C)  TempSrc:  Oral Oral Oral  Resp: 16 20 18 18   Height:  5\' 4"  (1.626 m)    Weight:  236 lb 3.2 oz (107.14 kg)  238 lb 1.6 oz (108 kg)  SpO2: 96% 96% 99% 100%    Intake/Output Summary (Last 24 hours) at 03/24/15 0945 Last data filed at 03/23/15 2129  Gross per 24 hour  Intake    480 ml  Output   1050 ml  Net   -570 ml   Filed Weights   03/23/15 1339 03/24/15 0628  Weight: 236 lb 3.2 oz (107.14 kg) 238 lb 1.6 oz (108 kg)    PHYSICAL EXAM General: Well developed, well nourished, female in no acute distress. Head: Normocephalic, atraumatic.  Neck: Supple without bruits, JVD not elevated. Lungs:  Resp regular and unlabored, CTA. Heart: RRR, S1, S2, no S3, S4, or murmur; no rub. Abdomen: Soft, non-tender, non-distended, BS + x 4.  Extremities: No clubbing, cyanosis, edema.  Neuro: Alert and oriented X 3. Moves all extremities spontaneously. Psych: Normal affect.  LABS: CBC: Recent Labs  03/23/15 1401 03/24/15 0136  WBC 11.7* 10.9*  HGB 13.9 13.6  HCT 41.3 40.6  MCV 89.0 89.2  PLT 303 277   INR: Recent Labs  03/24/15 0136  INR 1.14   Basic Metabolic Panel: Recent Labs  03/23/15 0905 03/23/15 1401 03/24/15 0136  NA 139  --  140  K 4.2  --  4.3  CL 106  --  107  CO2 22  --  22  GLUCOSE 103*  --  97  BUN 6  --  7  CREATININE 0.77 0.72 0.79  CALCIUM 9.5  --  9.6   Liver Function  Tests: Recent Labs  03/23/15 0905  AST 17  ALT 12*  ALKPHOS 90  BILITOT 0.4  PROT 7.3  ALBUMIN 3.8   Cardiac Enzymes: Recent Labs  03/23/15 1401 03/23/15 1923 03/24/15 0136  TROPONINI <0.03 <0.03 <0.03   BNP:  B NATRIURETIC PEPTIDE  Date/Time Value Ref Range Status  03/23/2015 09:05 AM 19.7 0.0 - 100.0 pg/mL Final   Fasting Lipid Panel: Recent Labs  03/24/15 0136  CHOL 160  HDL 40*  LDLCALC 100*  TRIG 101  CHOLHDL 4.0   TELE: SR, pt seen in nuc med       Radiology/Studies: Dg Chest 2 View  03/24/2015  CLINICAL DATA:  Chest pain EXAM: CHEST  2 VIEW COMPARISON:  03/23/2015 FINDINGS: Borderline cardiomegaly. Normal vascularity. Lungs are grossly clear a and mildly under aerated. No pneumothorax. IMPRESSION: Borderline cardiomegaly without decompensation. Electronically Signed   By: Jolaine ClickArthur  Hoss M.D.   On: 03/24/2015 08:02   Dg Chest 2 View  03/23/2015  CLINICAL DATA:  Mid chest pain for 2 days with shortness of breath and cough. EXAM: CHEST  2 VIEW COMPARISON:  10/24/2014 FINDINGS: Cardiac silhouette is upper  limits of normal in size. The patient has taken a slightly shallower inspiration than on the prior study and there are somewhat ill defined, hazy opacities in both lung bases. No pleural effusion or pneumothorax is seen. No acute osseous abnormality is identified. Right upper quadrant abdominal surgical clips are noted. IMPRESSION: Shallower inspiration with new, mild bibasilar opacities which may reflect pneumonia or atelectasis. Electronically Signed   By: Sebastian Ache M.D.   On: 03/23/2015 09:50     Current Medications:  . aspirin EC  81 mg Oral Daily  . benzonatate  100 mg Oral Q8H  . enoxaparin (LOVENOX) injection  40 mg Subcutaneous Q24H  . Influenza vac split quadrivalent PF  0.5 mL Intramuscular Tomorrow-1000  . pantoprazole  40 mg Oral Daily  . pneumococcal 23 valent vaccine  0.5 mL Intramuscular Tomorrow-1000      ASSESSMENT AND PLAN: Principal  Problem:   Chest pain of uncertain etiology - ez neg MI - GXT MV today to assess for ischemia.  - d/c later today if negative.    Lipid status - mild elevation in LDL - pt recently made dietary changes to lose weight and has decreased dietary fat and cholesterol - encouraged her to continue with this.  Otherwise, continue current care. Active Problems:   Family history of cardiac disorder in mother  Melida Quitter , [place] 9:45 AM 03/24/2015

## 2015-03-24 NOTE — Progress Notes (Signed)
UR Completed Nidal Rivet Graves-Bigelow, RN,BSN [phone]  

## 2015-03-24 NOTE — Discharge Summary (Signed)
CARDIOLOGY DISCHARGE SUMMARY   Patient ID: Katherine Goodman MRN: [id number] DOB/AGE: [birth year] 44 y.o.  Admit date: 03/23/2015 Discharge date: 03/24/2015  PCP: No primary care provider on file. Primary Cardiologist: Dr Eden Emms  Primary Discharge Diagnosis:    Chest pain of uncertain etiology Secondary Discharge Diagnosis:    Family history of cardiac disorder in mother   Precordial chest pain  Procedures: Exercise Myoview  Hospital Course: Katherine Goodman is a 44 y.o. female with no previous cardiac history. She had chest pain over the 2 days prior to admission and came to the hospital where she was admitted for further evaluation and treatment.  Her initial troponin was mildly elevated at 0.26, but all subsequent cardiac enzymes were negative for MI. An exercise Myoview was performed on 03/24/2015, results are below. It is at low risk study with no scar or ischemia and an EF of 66%.  Her initial chest x-ray was mildly abnormal so was repeated. The repeat ex-ray showed no significant abnormality.  A lipid profile was performed which showed borderline low HDL and mildly elevated LDL. Dietary changes are recommended and no medication is needed at this time.  On 03/24/2015, Katherine Goodman was ambulating without chest pain or shortness of breath. No further inpatient workup is indicated and she is considered stable for discharge, to follow-up as an outpatient.  Labs:   Lab Results  Component Value Date   WBC 10.9* 03/24/2015   HGB 13.6 03/24/2015   HCT 40.6 03/24/2015   MCV 89.2 03/24/2015   PLT 277 03/24/2015    Recent Labs Lab 03/23/15 0905  03/24/15 0136  NA 139  --  140  K 4.2  --  4.3  CL 106  --  107  CO2 22  --  22  BUN 6  --  7  CREATININE 0.77  < > 0.79  CALCIUM 9.5  --  9.6  PROT 7.3  --   --   BILITOT 0.4  --   --   ALKPHOS 90  --   --   ALT 12*  --   --   AST 17  --   --   GLUCOSE 103*  --  97  < > = values in this interval not  displayed.  Recent Labs  03/23/15 1401 03/23/15 1923 03/24/15 0136  TROPONINI <0.03 <0.03 <0.03   Lipid Panel     Component Value Date/Time   CHOL 160 03/24/2015 0136   TRIG 101 03/24/2015 0136   HDL 40* 03/24/2015 0136   CHOLHDL 4.0 03/24/2015 0136   VLDL 20 03/24/2015 0136   LDLCALC 100* 03/24/2015 0136    B NATRIURETIC PEPTIDE  Date/Time Value Ref Range Status  03/23/2015 09:05 AM 19.7 0.0 - 100.0 pg/mL Final    Recent Labs  03/24/15 0136  INR 1.14      Radiology: Dg Chest 2 View 03/24/2015  CLINICAL DATA:  Chest pain EXAM: CHEST  2 VIEW COMPARISON:  03/23/2015 FINDINGS: Borderline cardiomegaly. Normal vascularity. Lungs are grossly clear a and mildly under aerated. No pneumothorax. IMPRESSION: Borderline cardiomegaly without decompensation. Electronically Signed   By: Jolaine Click M.D.   On: 03/24/2015 08:02   Dg Chest 2 View 03/23/2015  CLINICAL DATA:  Mid chest pain for 2 days with shortness of breath and cough. EXAM: CHEST  2 VIEW COMPARISON:  10/24/2014 FINDINGS: Cardiac silhouette is upper limits of normal in size. The patient has taken a slightly shallower inspiration than on the prior study and  there are somewhat ill defined, hazy opacities in both lung bases. No pleural effusion or pneumothorax is seen. No acute osseous abnormality is identified. Right upper quadrant abdominal surgical clips are noted. IMPRESSION: Shallower inspiration with new, mild bibasilar opacities which may reflect pneumonia or atelectasis. Electronically Signed   By: Sebastian Ache M.D.   On: 03/23/2015 09:50   Nm Myocar Multi W/spect W/wall Motion / Ef 03/24/2015  CLINICAL DATA:  44 year old female with increased chest pain for 3 days. EXAM: MYOCARDIAL IMAGING WITH SPECT (REST AND EXERCISE) GATED LEFT VENTRICULAR WALL MOTION STUDY LEFT VENTRICULAR EJECTION FRACTION TECHNIQUE: Standard myocardial SPECT imaging was performed after resting intravenous injection of 10 mCi Tc-32m sestamibi.  Subsequently, exercise tolerance test was performed by the patient under the supervision of the Cardiology staff. At peak-stress, 30 mCi Tc-25m sestamibi was injected intravenously and standard myocardial SPECT imaging was performed. Quantitative gated imaging was also performed to evaluate left ventricular wall motion, and estimate left ventricular ejection fraction. COMPARISON:  None. FINDINGS: Perfusion: No decreased activity in the left ventricle on stress imaging to suggest reversible ischemia or infarction. Mild anterior attenuation on both breasts distress images is likely breast artifact. Wall Motion: Normal left ventricular wall motion. No left ventricular dilation. Left Ventricular Ejection Fraction: 66 % End diastolic volume 69 ml End systolic volume 24 ml IMPRESSION: 1. No reversible ischemia or infarction. 2. Normal left ventricular wall motion. 3. Left ventricular ejection fraction 66% 4. Low-risk stress test findings*. *2012 Appropriate Use Criteria for Coronary Revascularization Focused Update: J Am Coll Cardiol. 2012;59(9):857-881. http://content.[email or web address].com.aspx?articleid=[email or web address] Electronically Signed   By: Harmon Pier M.D.   On: 03/24/2015 13:23    EKG: 03/24/2015 Sinus rhythm, no acute ischemic changes Vent. rate 66 BPM PR interval 118 ms QRS duration 84 ms QT/QTc 418/438 ms P-R-T axes 20 59 54  ECHO: 03/23/2015 Study Conclusions - Left ventricle: The cavity size was normal. Wall thickness was normal. Systolic function was normal. The estimated ejection fraction was in the range of 60% to 65%. Wall motion was normal; there were no regional wall motion abnormalities. Left ventricular diastolic function parameters were normal. - Aortic valve: There was no stenosis. - Mitral valve: There was no significant regurgitation. - Right ventricle: The cavity size was normal. Systolic function was normal. - Pulmonary arteries: No complete TR doppler jet so unable  to estimate PA systolic pressure. - Inferior vena cava: The vessel was normal in size. The respirophasic diameter changes were in the normal range (>= 50%), consistent with normal central venous pressure. Impressions: - Normal LV size with EF 60-65%. Normal diastolic function. Normal RV size and systolic function. No significant valvular abnormalities.  FOLLOW UP PLANS AND APPOINTMENTS No Known Allergies   Medication List    STOP taking these medications        oxymetazoline 0.05 % nasal spray  Commonly known as:  AFRIN NASAL SPRAY     pseudoephedrine 60 MG tablet  Commonly known as:  SUDAFED      TAKE these medications        benzonatate 100 MG capsule  Commonly known as:  TESSALON  Take 1 capsule (100 mg total) by mouth every 8 (eight) hours.     doxycycline 100 MG capsule  Commonly known as:  VIBRAMYCIN  Take 1 capsule (100 mg total) by mouth 2 (two) times daily.     multivitamin with minerals Tabs tablet  Take 1 tablet by mouth daily.     polyethylene glycol packet  Commonly known  as:  MIRALAX / GLYCOLAX  Take 17 g by mouth daily as needed for moderate constipation.        Discharge Instructions    Diet - low sodium heart healthy    Complete by:  As directed      Increase activity slowly    Complete by:  As directed           Follow-up Information    Follow up with Charlton HawsPeter Nishan, MD.   Specialty:  Cardiology   Why:  As needed   Contact information:   [address] [place] [address] [phone]       BRING ALL MEDICATIONS WITH YOU TO FOLLOW UP APPOINTMENTS  Time spent with patient to include physician time: 43 min Signed: Theodore DemarkBarrett, Rhonda, PA-C 03/24/2015, 3:42 PM Co-Sign MD

## 2015-03-24 NOTE — Care Management Note (Addendum)
Case Management Note  Patient Details  Name: Katherine Goodman MRN: [id number] Date of Birth: 08/23/1970  Subjective/Objective:   Pt admitted for Chest Pain and plans to d/c post stress test.                  Action/Plan: No needs identified by CM at this time.    Expected Discharge Date:  03/23/15               Expected Discharge Plan:  Home/Self Care  In-House Referral:  NA  Discharge planning Services  CM Consult  Post Acute Care Choice:  NA Choice offered to:  NA  DME Arranged:  N/A DME Agency:  NA  HH Arranged:  NA HH Agency:  NA  Status of Service:  Completed, signed off  Medicare Important Message Given:    Date Medicare IM Given:    Medicare IM give by:    Date Additional Medicare IM Given:    Additional Medicare Important Message give by:     If discussed at Long Length of Stay Meetings, dates discussed:    Additional Comments:  Gala LewandowskyGraves-Bigelow, Katherine Persons Kaye, Katherine Goodman 03/24/2015, 12:26 PM
